# Patient Record
Sex: Female | Born: 1970 | Race: Black or African American | Hispanic: No | Smoking: Never smoker
Health system: Southern US, Community
[De-identification: ages and names within clinical notes are randomized; demographics above are authoritative.]

## PROBLEM LIST (undated history)

## (undated) DIAGNOSIS — E079 Disorder of thyroid, unspecified: Secondary | ICD-10-CM

## (undated) DIAGNOSIS — C73 Malignant neoplasm of thyroid gland: Secondary | ICD-10-CM

## (undated) DIAGNOSIS — R112 Nausea with vomiting, unspecified: Secondary | ICD-10-CM

## (undated) DIAGNOSIS — Z9889 Other specified postprocedural states: Secondary | ICD-10-CM

## (undated) DIAGNOSIS — D649 Anemia, unspecified: Secondary | ICD-10-CM

## (undated) DIAGNOSIS — F419 Anxiety disorder, unspecified: Secondary | ICD-10-CM

## (undated) HISTORY — PX: TUBAL LIGATION: SHX77

## (undated) HISTORY — PX: DIAGNOSTIC LAPAROSCOPY: SUR761

## (undated) HISTORY — PX: DILATION AND CURETTAGE OF UTERUS: SHX78

## (undated) HISTORY — PX: OTHER SURGICAL HISTORY: SHX169

---

## 1999-06-27 ENCOUNTER — Other Ambulatory Visit: Admission: RE | Admit: 1999-06-27 | Discharge: 1999-06-27 | Payer: Self-pay | Admitting: Obstetrics and Gynecology

## 2000-10-29 ENCOUNTER — Other Ambulatory Visit: Admission: RE | Admit: 2000-10-29 | Discharge: 2000-10-29 | Payer: Self-pay | Admitting: Obstetrics and Gynecology

## 2003-09-07 ENCOUNTER — Encounter: Admission: RE | Admit: 2003-09-07 | Discharge: 2003-09-07 | Payer: Self-pay | Admitting: Obstetrics and Gynecology

## 2003-09-07 ENCOUNTER — Encounter: Payer: Self-pay | Admitting: Obstetrics and Gynecology

## 2008-09-05 ENCOUNTER — Ambulatory Visit (HOSPITAL_COMMUNITY): Admission: AD | Admit: 2008-09-05 | Discharge: 2008-09-05 | Payer: Self-pay | Admitting: Obstetrics and Gynecology

## 2009-03-28 ENCOUNTER — Encounter: Admission: RE | Admit: 2009-03-28 | Discharge: 2009-03-28 | Payer: Self-pay | Admitting: Obstetrics and Gynecology

## 2010-03-30 ENCOUNTER — Ambulatory Visit (HOSPITAL_COMMUNITY): Admission: RE | Admit: 2010-03-30 | Discharge: 2010-03-30 | Payer: Self-pay | Admitting: Obstetrics and Gynecology

## 2010-12-24 ENCOUNTER — Encounter: Payer: Self-pay | Admitting: Obstetrics and Gynecology

## 2011-04-17 NOTE — Op Note (Signed)
NAMEBIRTHA, HATLER                 ACCOUNT NO.:  [id number]   MEDICAL RECORD NO.:  [id number]          PATIENT TYPE:  AMB   LOCATION:  [facility]                          FACILITY:  [facility]   PHYSICIAN:  Kendra H. Tenny Craw, MD     DATE OF BIRTH:  [birth year]   DATE OF PROCEDURE:  09/05/2008  DATE OF DISCHARGE:                               OPERATIVE REPORT   PREOPERATIVE DIAGNOSIS:  Left ectopic pregnancy.   POSTOPERATIVE DIAGNOSIS:  Left ectopic pregnancy.   PROCEDURE:  Diagnostic laparoscopy with left salpingectomy and a  dilation and curettage.   INDICATIONS FOR PROCEDURE:  Ms. Koble is a 40 year old G3, P0-0-2-0, who  is status post in vitro fertilization with embryo transfer on August 02, 2008.  She should have been 7 weeks and 4 days by embryo transfer.  She  was seen in the office and had a quantitative hCG drawn on August 23, 2008, which showed a quantitative hCG of 2000.  She returned to the  office on September 03, 2008, and a vaginal ultrasound was performed, which  demonstrated no evidence of intrauterine pregnancy with an endometrial  thickness of 7 mm and normal-appearing adnexa bilaterally.  Quantitative  hCG was drawn at that time, which demonstrated a quantitative hCG of  almost 17,000.  At this point, there was concern for an ectopic  pregnancy, and the patient was instructed to follow up in 48 hours for  repeat quantitative hCG and ultrasound.  On September 05, 2008, an  ultrasound demonstrated a left ectopic pregnancy, measuring 2.6 x 1.9 x  2.4 cm.  The right side was within normal limits.  No free fluid was  noted.  Given that the pregnancy resulted to embryo transfer, the  patient was advised that there was a possibility of a second ectopic  pregnancy versus a failed intrauterine pregnancy of the first embryo,  and the decision was made to also perform a dilation and curettage.   DESCRIPTION OF PROCEDURE:  Following the appropriate informed consent,  the patient was  brought to the operating room where she was placed in  the lithotomy position in [place] stirrups.  She is prepped and draped in  a normal sterile fashion.  A speculum was placed in the vagina and a  Hulka tenaculum was placed on the anterior lip of the cervix.  The  speculum was removed and attention was then turned to the abdominal  cavity.  A scalpel was then used to make a semilunar infraumbilical  incision.  S retractors were used to dissect down bluntly to the level  of the fascia.  The fascia was grasped with Kocher clamps x2, tented up,  and the fascia was incised with Mayo scissors.  Intraabdominal access  was confirmed with direct visualization.  Vicryl sutures #1 were used to  tag the fascia on either side and the Hasson port was introduced.  The  camera was then used to confirm intraabdominal placement, and the  abdominal cavity was insufflated.  At this time, a right lower quadrant  5-mm port was placed with direct visualization after  the skin was  incised with a scalpel.  A 10/12 mm port was then placed in the left  lower quadrant with direct visualization after a 10-mm incision was made  with the scalpel.  The ectopic was identified in the left tube.  The  ovaries were noted to be normal bilaterally.  The posterior cul-de-sac  was normal.  The uterus was normal.  The appendix was normal.  The liver  was normal, and the bowels were normal.  The tube was grasped with a  blunt grasper and the gyrus was used to excise the fallopian tube along  the mesosalpinx.  Hemostasis was noted.  Dr. Duane Lope of the teaching  service stepped in to the operating room for assistance with the removal  of the left tube.  The right and left lower quadrant ports were then  removed under direct visualization.  The abdomen was desufflated.  The  infraumbilical fascia was repaired with #1 Vicryl.  The skin incisions  were repaired with 3-0 subcuticular stitches and dermabond.  Attention  was then  turned top the vaginal portion of the case.  A speculum was  placed in the vagina and the hulka tenaculum removed and replaced with a  single toothed tenaculum.  The cervix was serially dilated and a sharp  curretage was performed with removal of endometrial curettings.  This  completed the case, the single tooth tenaculum was removed from the  cervix, and the speculum was removed.  The patient was taken out of  lithotomy position, anesthesia was reversed, and she was extubated and  transferred to the recovery room in stable condition.  All sponge, lap,  and needle counts were correct x 2.      Freddrick March. Tenny Craw, MD  Electronically Signed     KHR/MEDQ  D:  09/05/2008  T:  09/06/2008  Job:  [id number]

## 2011-06-21 ENCOUNTER — Other Ambulatory Visit: Payer: Self-pay | Admitting: Obstetrics and Gynecology

## 2011-09-04 LAB — CBC
HCT: 41.6
MCV: 88.5
RBC: 4.7
WBC: 8.3

## 2011-09-04 LAB — COMPREHENSIVE METABOLIC PANEL
Albumin: 3.4 — ABNORMAL LOW
BUN: 3 — ABNORMAL LOW
Creatinine, Ser: 0.79
Glucose, Bld: 94
Total Protein: 7.2

## 2011-09-04 LAB — TYPE AND SCREEN
ABO/RH(D): O POS
Antibody Screen: NEGATIVE

## 2011-09-04 LAB — PROTIME-INR: Prothrombin Time: 13.2

## 2011-09-04 LAB — APTT: aPTT: 29

## 2011-09-04 LAB — HCG, QUANTITATIVE, PREGNANCY: hCG, Beta Chain, Quant, S: 19795 — ABNORMAL HIGH

## 2011-09-25 ENCOUNTER — Other Ambulatory Visit: Payer: Self-pay | Admitting: Obstetrics and Gynecology

## 2011-10-24 ENCOUNTER — Encounter (HOSPITAL_COMMUNITY): Payer: Self-pay | Admitting: Pharmacist

## 2011-10-24 NOTE — H&P (Signed)
  HPI: 40 yo G3P0020 presents for a suction D&C for retained products after taking misoprostal for a 6 week missed abortion.  Pt was initially diagnosed with a missed abortion on 10/02/2011.  At that time she elected medical management with misoprostal.  She did have evacuation of some POCs but F/U ultrasound demonstrated retained POCs.  She attempted misoprostal on 2 subsequent occasions but this was unsuccessful at uterine evacuation.  She presents on 10/31/11 for a D&C for retained POCs.   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 POBGYN: [id number]  2008: Missed abortion s/p d&c  2009: Left ectopic, s/p L/S left salpingectomy  No abnormal paps, no STDs Meds: PNV ALL: NKDA PE: AFVSS AOX3 Soft NT/ND TVUS retained POCs A/P: 39 yo G3P0020 with retained POCs 1) Consent' 2) To OR for D&C

## 2011-10-31 ENCOUNTER — Ambulatory Visit (HOSPITAL_COMMUNITY)
Admission: RE | Admit: 2011-10-31 | Discharge: 2011-10-31 | Disposition: A | Discharge status: Home / Self Care | Payer: 59 | Source: Ambulatory Visit | Attending: Obstetrics and Gynecology | Admitting: Obstetrics and Gynecology

## 2011-10-31 ENCOUNTER — Encounter (HOSPITAL_COMMUNITY)
Admission: RE | Disposition: A | Discharge status: Home / Self Care | Payer: Self-pay | Source: Ambulatory Visit | Attending: Obstetrics and Gynecology

## 2011-10-31 ENCOUNTER — Other Ambulatory Visit: Payer: Self-pay | Admitting: Obstetrics and Gynecology

## 2011-10-31 ENCOUNTER — Encounter (HOSPITAL_COMMUNITY): Payer: Self-pay | Admitting: Certified Registered Nurse Anesthetist

## 2011-10-31 ENCOUNTER — Ambulatory Visit (HOSPITAL_COMMUNITY): Payer: 59 | Admitting: Certified Registered Nurse Anesthetist

## 2011-10-31 ENCOUNTER — Encounter (HOSPITAL_COMMUNITY): Payer: Self-pay | Admitting: *Deleted

## 2011-10-31 DIAGNOSIS — O021 Missed abortion: Secondary | ICD-10-CM | POA: Insufficient documentation

## 2011-10-31 HISTORY — PX: DILATION AND EVACUATION: SHX1459

## 2011-10-31 LAB — CBC
HCT: 35.4 % — ABNORMAL LOW (ref 36.0–46.0)
MCHC: 33.6 g/dL (ref 30.0–36.0)
MCV: 84.7 fL (ref 78.0–100.0)
Platelets: 360 10*3/uL (ref 150–400)
RDW: 13.4 % (ref 11.5–15.5)
WBC: 5.4 10*3/uL (ref 4.0–10.5)

## 2011-10-31 SURGERY — DILATION AND EVACUATION, UTERUS
Site: Uterus | Laterality: Bilateral | Wound class: Contaminated

## 2011-10-31 MED ORDER — MIDAZOLAM HCL 5 MG/5ML IJ SOLN
INTRAMUSCULAR | Status: DC | PRN
  Administered 2011-10-31: 2 mg via INTRAVENOUS

## 2011-10-31 MED ORDER — PROPOFOL 10 MG/ML IV EMUL
INTRAVENOUS | Status: AC
  Filled 2011-10-31: qty 20

## 2011-10-31 MED ORDER — MIDAZOLAM HCL 2 MG/2ML IJ SOLN
INTRAMUSCULAR | Status: AC
  Filled 2011-10-31: qty 2

## 2011-10-31 MED ORDER — ACETAMINOPHEN 325 MG PO TABS: 325.0000 mg | ORAL_TABLET | ORAL | Status: DC | PRN

## 2011-10-31 MED ORDER — SODIUM CHLORIDE 0.9 % IV SOLN: INTRAVENOUS | Status: DC

## 2011-10-31 MED ORDER — PROMETHAZINE HCL 25 MG/ML IJ SOLN: 6.2500 mg | INTRAMUSCULAR | Status: DC | PRN

## 2011-10-31 MED ORDER — KETOROLAC TROMETHAMINE 30 MG/ML IJ SOLN
INTRAMUSCULAR | Status: DC | PRN
  Administered 2011-10-31: 30 mg via INTRAVENOUS

## 2011-10-31 MED ORDER — ONDANSETRON HCL 4 MG/2ML IJ SOLN
INTRAMUSCULAR | Status: AC
  Filled 2011-10-31: qty 2

## 2011-10-31 MED ORDER — IBUPROFEN 600 MG PO TABS: 600.0000 mg | ORAL_TABLET | Freq: Four times a day (QID) | ORAL | Status: AC | PRN

## 2011-10-31 MED ORDER — KETOROLAC TROMETHAMINE 30 MG/ML IJ SOLN: 15.0000 mg | Freq: Once | INTRAMUSCULAR | Status: DC | PRN

## 2011-10-31 MED ORDER — ONDANSETRON HCL 4 MG/2ML IJ SOLN
INTRAMUSCULAR | Status: DC | PRN
  Administered 2011-10-31: 4 mg via INTRAVENOUS

## 2011-10-31 MED ORDER — GLYCOPYRROLATE 0.2 MG/ML IJ SOLN
INTRAMUSCULAR | Status: DC | PRN
  Administered 2011-10-31: 0.2 mg via INTRAVENOUS

## 2011-10-31 MED ORDER — FENTANYL CITRATE 0.05 MG/ML IJ SOLN
INTRAMUSCULAR | Status: DC | PRN
  Administered 2011-10-31 (×2): 50 ug via INTRAVENOUS

## 2011-10-31 MED ORDER — FENTANYL CITRATE 0.05 MG/ML IJ SOLN: 25.0000 ug | INTRAMUSCULAR | Status: DC | PRN

## 2011-10-31 MED ORDER — HYDROCODONE-ACETAMINOPHEN 5-500 MG PO TABS: 1.0000 | ORAL_TABLET | ORAL | Status: AC | PRN

## 2011-10-31 MED ORDER — LACTATED RINGERS IV SOLN
INTRAVENOUS | Status: DC
  Administered 2011-10-31 (×2): via INTRAVENOUS

## 2011-10-31 MED ORDER — LIDOCAINE HCL 1 % IJ SOLN
INTRAMUSCULAR | Status: DC | PRN
  Administered 2011-10-31: 10 mL

## 2011-10-31 MED ORDER — FENTANYL CITRATE 0.05 MG/ML IJ SOLN
INTRAMUSCULAR | Status: AC
  Filled 2011-10-31: qty 2

## 2011-10-31 MED ORDER — PROPOFOL 10 MG/ML IV EMUL
INTRAVENOUS | Status: DC | PRN
  Administered 2011-10-31 (×3): 20 mg via INTRAVENOUS
  Administered 2011-10-31: 50 mg via INTRAVENOUS
  Administered 2011-10-31 (×3): 20 mg via INTRAVENOUS

## 2011-10-31 MED ORDER — LIDOCAINE HCL (CARDIAC) 20 MG/ML IV SOLN
INTRAVENOUS | Status: DC | PRN
  Administered 2011-10-31: 50 mg via INTRAVENOUS

## 2011-10-31 MED ORDER — LIDOCAINE HCL (CARDIAC) 20 MG/ML IV SOLN
INTRAVENOUS | Status: AC
  Filled 2011-10-31: qty 5

## 2011-10-31 MED ORDER — GLYCOPYRROLATE 0.2 MG/ML IJ SOLN
INTRAMUSCULAR | Status: AC
  Filled 2011-10-31: qty 1

## 2011-10-31 SURGICAL SUPPLY — 12 items
CATH ROBINSON RED A/P 16FR (CATHETERS) ×3 IMPLANT
CLOTH BEACON ORANGE TIMEOUT ST (SAFETY) ×3 IMPLANT
DILATOR CANAL MILEX (MISCELLANEOUS) IMPLANT
DRAPE UTILITY XL STRL (DRAPES) ×3 IMPLANT
GLOVE BIO SURGEON STRL SZ7 (GLOVE) ×12 IMPLANT
GOWN PREVENTION PLUS LG XLONG (DISPOSABLE) ×3 IMPLANT
NEEDLE SPNL 22GX3.5 QUINCKE BK (NEEDLE) ×3 IMPLANT
PACK VAGINAL MINOR WOMEN LF (CUSTOM PROCEDURE TRAY) ×3 IMPLANT
PAD PREP 24X48 CUFFED NSTRL (MISCELLANEOUS) ×3 IMPLANT
SYR CONTROL 10ML LL (SYRINGE) ×3 IMPLANT
TOWEL OR 17X24 6PK STRL BLUE (TOWEL DISPOSABLE) ×6 IMPLANT
VACURETTE 6 ASPIR F TIP BERK (CANNULA) ×3 IMPLANT

## 2011-10-31 NOTE — Discharge Summary (Signed)
  D/C Summary  40 yo G3P0020 with retained products of conception after medical management of a 6 week missed abortion. Pt presented for a suction dilation and evacuation and underwent the procedure without incident.  She is discharged home with prescriptions for Vicodin and Ibuprofen.  She will follow up in the office in 2-4 weeks for a postoperative evaluation.

## 2011-10-31 NOTE — Transfer of Care (Signed)
Immediate Anesthesia Transfer of Care Note  Patient: Connie Randall  Procedure(s) Performed:  DILATATION AND EVACUATION (D&E)  Patient Location: PACU  Anesthesia Type: MAC  Level of Consciousness: awake, alert  and sedated  Airway & Oxygen Therapy: Patient Spontanous Breathing  Post-op Assessment: Report given to PACU RN and Post -op Vital signs reviewed and stable  Post vital signs: Reviewed and stable  Complications: No apparent anesthesia complications

## 2011-10-31 NOTE — Anesthesia Preprocedure Evaluation (Addendum)
Anesthesia Evaluation  Patient identified by MRN, date of birth, ID band Patient awake    Reviewed: Allergy & Precautions, H&P , Patient's Chart, lab work & pertinent test results, reviewed documented beta blocker date and time   History of Anesthesia Complications Negative for: history of anesthetic complications  Airway Mallampati: II TM Distance: >3 FB Neck ROM: full    Dental No notable dental hx.    Pulmonary neg pulmonary ROS,  clear to auscultation  Pulmonary exam normal       Cardiovascular Exercise Tolerance: Good neg cardio ROS regular Normal    Neuro/Psych Negative Neurological ROS  Negative Psych ROS   GI/Hepatic negative GI ROS, Neg liver ROS,   Endo/Other  Negative Endocrine ROS  Renal/GU negative Renal ROS     Musculoskeletal   Abdominal   Peds  Hematology negative hematology ROS (+)   Anesthesia Other Findings   Reproductive/Obstetrics negative OB ROS                           Anesthesia Physical Anesthesia Plan  ASA: II  Anesthesia Plan: MAC   Post-op Pain Management:    Induction:   Airway Management Planned:   Additional Equipment:   Intra-op Plan:   Post-operative Plan:   Informed Consent: I have reviewed the patients History and Physical, chart, labs and discussed the procedure including the risks, benefits and alternatives for the proposed anesthesia with the patient or authorized representative who has indicated his/her understanding and acceptance.   Dental Advisory Given  Plan Discussed with: CRNA and Surgeon  Anesthesia Plan Comments:         Anesthesia Quick Evaluation  

## 2011-10-31 NOTE — Interval H&P Note (Signed)
History and Physical Interval Note:  10/31/2011 11:10 AM  Connie Randall  has presented today for surgery, with the diagnosis of RETAINED PRODUCTS OF CONCEPTION  The various methods of treatment have been discussed with the patient and family. After consideration of risks, benefits and other options for treatment, the patient has consented to  Procedure(s): DILATATION AND CURETTAGE (D&C) as a surgical intervention .  The patients' history has been reviewed, patient examined, no change in status, stable for surgery.  I have reviewed the patients' chart and labs.  Questions were answered to the patient's satisfaction.     Dacey Milberger H.

## 2011-10-31 NOTE — Brief Op Note (Signed)
10/31/2011  12:46 PM  PATIENT:  Vernice Jefferson  40 y.o. female  PRE-OPERATIVE DIAGNOSIS:  RETAINED PRODUCTS OF CONCEPTION  POST-OPERATIVE DIAGNOSIS:  retained products of conception  PROCEDURE:  Procedure(s): DILATATION AND EVACUATION (D&E)  SURGEON:  Surgeon(s): Jasaun Carn H. Cinzia Devos  PHYSICIAN ASSISTANT:   ASSISTANTS: none   ANESTHESIA:   local and IV sedation  EBL:  Total I/O In: 1000 [I.V.:1000] Out: 100 [Urine:50; Blood:50]  BLOOD ADMINISTERED:none  DRAINS: Urinary Catheter (Foley)   LOCAL MEDICATIONS USED:  LIDOCAINE 10CC  SPECIMEN:  Source of Specimen:  products of conception  DISPOSITION OF SPECIMEN:  PATHOLOGY  COUNTS:  YES  TOURNIQUET:  * No tourniquets in log *  DICTATION: .Dragon Dictation  PLAN OF CARE: Admit to inpatient   PATIENT DISPOSITION:  Short Stay   Delay start of Pharmacological VTE agent (>24hrs) due to surgical blood loss or risk of bleeding:  {YES/NO/NOT APPLICABLE:20182

## 2011-10-31 NOTE — Anesthesia Postprocedure Evaluation (Signed)
Anesthesia Post Note  Patient: Connie Randall  Procedure(s) Performed:  DILATATION AND EVACUATION (D&E)  Anesthesia type: MAC  Patient location: PACU  Post pain: Pain level controlled  Post assessment: Post-op Vital signs reviewed  Last Vitals:  Filed Vitals:   10/31/11 1047  BP: 122/93  Pulse: 67  Temp: 36.8 C  Resp: 18    Post vital signs: Reviewed  Level of consciousness: sedated  Complications: No apparent anesthesia complications

## 2011-10-31 NOTE — Op Note (Addendum)
Pre-Operative Diagnosis: 1) Retained products of conception after failed medical evaluation Postoperative Diagnosis: same Procedure: Suction D&C Surgeon: Dr. Waynard Reeds Assistant: None Anesthesia: IV sedation and 10cc 1% lidocaine paracervical block Operative Findings: POCs Specimen: POCs [id number] I/O In: 1000 [I.V.:1000] Out: 100 [Urine:50; Blood:50] Procedure: Connie Randall is a 40 yo G3P0020 who was diagnosed with a 6 week missed abortion.  She attempted medical management with misoprostal evacuation but had retained products of conception.  All management options were reviewed and the patient requested to proceed with surgery.  R/B/A were reviewed.  After the appropriate informed consent the patient was taken to the OR and placed in lithotomy position in [place] stirrups.  IV sedation was administered and the patient was prepped and draped in the normal sterile fashion.  A speculum was placed in the vagina, a single-toothed tenaculum was placed on the anterior lip of the cervix. One % lidocaine, 10 cc were injected in a paracervical fashion. The cervix was serially dilated with Shawnie Pons dilators.  A #6 suction curette was placed transcervically to the level of the fundus.  The vacuum was engaged and multiple suction passes were undertaken with removal of products of conception.  A sharp curettage was performed and a gritty texture was noted.  This completed the procedure and the patient was taken to the PACU in stable condition.

## 2011-11-01 ENCOUNTER — Encounter (HOSPITAL_COMMUNITY): Payer: Self-pay | Admitting: Obstetrics and Gynecology

## 2011-11-24 DIAGNOSIS — X58XXXA Exposure to other specified factors, initial encounter: Secondary | ICD-10-CM | POA: Insufficient documentation

## 2011-11-24 DIAGNOSIS — T2640XA Burn of unspecified eye and adnexa, part unspecified, initial encounter: Secondary | ICD-10-CM | POA: Insufficient documentation

## 2011-11-25 ENCOUNTER — Encounter (HOSPITAL_BASED_OUTPATIENT_CLINIC_OR_DEPARTMENT_OTHER): Payer: Self-pay | Admitting: *Deleted

## 2011-11-25 ENCOUNTER — Emergency Department (HOSPITAL_BASED_OUTPATIENT_CLINIC_OR_DEPARTMENT_OTHER)
Admission: EM | Admit: 2011-11-25 | Discharge: 2011-11-25 | Disposition: A | Discharge status: Home / Self Care | Payer: 59 | Attending: Emergency Medicine | Admitting: Emergency Medicine

## 2011-11-25 DIAGNOSIS — T2690XA Corrosion of unspecified eye and adnexa, part unspecified, initial encounter: Secondary | ICD-10-CM

## 2011-11-25 MED ORDER — PREDNISOLONE ACETATE 1 % OP SUSP
1.0000 [drp] | OPHTHALMIC | Status: DC
  Administered 2011-11-25: 1 [drp] via OPHTHALMIC
  Filled 2011-11-25: qty 5

## 2011-11-25 MED ORDER — TETRACAINE HCL 0.5 % OP SOLN
OPHTHALMIC | Status: AC
  Filled 2011-11-25: qty 2

## 2011-11-25 MED ORDER — FLUORESCEIN SODIUM 1 MG OP STRP
ORAL_STRIP | OPHTHALMIC | Status: AC
  Filled 2011-11-25: qty 1

## 2011-11-25 MED ORDER — CIPROFLOXACIN HCL 0.3 % OP SOLN
2.0000 [drp] | OPHTHALMIC | Status: DC
  Administered 2011-11-25: 2 [drp] via OPHTHALMIC
  Filled 2011-11-25: qty 2.5

## 2011-11-25 NOTE — ED Notes (Signed)
Pt states she got glycolic acid in her left eye 1 week ago - was prescribed azelastine drops which are not working- has pain and light sensitivity- drainage also

## 2011-11-25 NOTE — ED Provider Notes (Signed)
History     CSN: [id number]  Arrival date & time 11/24/11  2338   First MD Initiated Contact with Patient 11/25/11 0534      Chief Complaint  Patient presents with  . Eye Injury    (Consider location/radiation/quality/duration/timing/severity/associated sxs/prior treatment) HPI  this is a 40 year old black female who got a chemical containing glycolic acid in her left eye about a week ago. She urinated at the time and was not significantly symptomatic. Subsequently the eye has become more and more irritated with moderate pain, exudate, erythema and severe photophobia. She saw her medical doctor 3 days ago and was prescribed Optivar 0.05% ophthalmic drops. She has not had any relief and has in fact worsened.  History reviewed. No pertinent past medical history.  Past Surgical History  Procedure Date  . Diagnostic laparoscopy   . Dilation and curettage of uterus   . Egg retrieval   . Dilation and evacuation 10/31/2011    Procedure: DILATATION AND EVACUATION (D&E);  Surgeon: Almon Hercules;  Location: [facility];  Service: Gynecology;;    No family history on file.  History  Substance Use Topics  . Smoking status: Never Smoker   . Smokeless tobacco: Never Used  . Alcohol Use: Yes     rare    OB History    Grav Para Term Preterm Abortions TAB SAB Ect Mult Living                  Review of Systems  All other systems reviewed and are negative.    Allergies  Review of patient's allergies indicates no known allergies.  Home Medications   Current Outpatient Rx  Name Route Sig Dispense Refill  . AZELASTINE HCL 0.05 % OP SOLN  1 drop 2 (two) times daily.      Marland Kitchen CLINDAMYCIN HCL PO Oral Take by mouth.      Marland Kitchen BEYAZ PO Oral Take by mouth.        BP 130/69  Pulse 71  Temp(Src) 98.3 F (36.8 C) (Oral)  Resp 19  SpO2 100%  LMP 11/22/2011  Physical Exam General: Well-developed, well-nourished female in no acute distress; appearance consistent with age of record HENT:  normocephalic, atraumatic Eyes: pupils equal round and reactive to light; extraocular muscles intact; left conjunctival injection and exudate; slight left corneal opacity without fluorescein uptake under Wood's lamp examination Neck: supple Heart: regular rate and rhythm Lungs: Normal respiratory effort and Abdomen: soft; nondistended Extremities: No deformity; full range of motion Neurologic: Awake, alert and oriented; motor function intact in all extremities and symmetric; no facial droop Skin: Warm and dry Psychiatric: Normal mood and affect    ED Course  Procedures (including critical care time)   MDM  Dr. Delaney Meigs will see the patient in his office today at 10:30 AM. He advised Pred forte and fluoroquinolone eyedrops hourly until he sees her.          Hanley Seamen, MD 11/25/11 [phone]

## 2014-09-28 ENCOUNTER — Other Ambulatory Visit: Payer: Self-pay | Admitting: Obstetrics and Gynecology

## 2014-09-29 LAB — CYTOLOGY - PAP

## 2014-12-29 ENCOUNTER — Encounter (HOSPITAL_BASED_OUTPATIENT_CLINIC_OR_DEPARTMENT_OTHER): Payer: Self-pay

## 2014-12-29 ENCOUNTER — Emergency Department (HOSPITAL_BASED_OUTPATIENT_CLINIC_OR_DEPARTMENT_OTHER)
Admission: EM | Admit: 2014-12-29 | Discharge: 2014-12-29 | Disposition: A | Discharge status: Home / Self Care | Payer: 59 | Attending: Emergency Medicine | Admitting: Emergency Medicine

## 2014-12-29 DIAGNOSIS — M549 Dorsalgia, unspecified: Secondary | ICD-10-CM | POA: Diagnosis present

## 2014-12-29 DIAGNOSIS — R0602 Shortness of breath: Secondary | ICD-10-CM | POA: Diagnosis not present

## 2014-12-29 DIAGNOSIS — Z3202 Encounter for pregnancy test, result negative: Secondary | ICD-10-CM | POA: Diagnosis not present

## 2014-12-29 DIAGNOSIS — R112 Nausea with vomiting, unspecified: Secondary | ICD-10-CM | POA: Diagnosis not present

## 2014-12-29 DIAGNOSIS — N390 Urinary tract infection, site not specified: Secondary | ICD-10-CM | POA: Diagnosis not present

## 2014-12-29 DIAGNOSIS — H539 Unspecified visual disturbance: Secondary | ICD-10-CM | POA: Insufficient documentation

## 2014-12-29 LAB — URINALYSIS, ROUTINE W REFLEX MICROSCOPIC
Bilirubin Urine: NEGATIVE
GLUCOSE, UA: NEGATIVE mg/dL
Ketones, ur: 15 mg/dL — AB
Nitrite: POSITIVE — AB
PH: 5.5 (ref 5.0–8.0)
Protein, ur: NEGATIVE mg/dL
Specific Gravity, Urine: 1.014 (ref 1.005–1.030)
UROBILINOGEN UA: 2 mg/dL — AB (ref 0.0–1.0)

## 2014-12-29 LAB — URINE MICROSCOPIC-ADD ON

## 2014-12-29 LAB — PREGNANCY, URINE: Preg Test, Ur: NEGATIVE

## 2014-12-29 MED ORDER — FLUCONAZOLE 50 MG PO TABS
150.0000 mg | ORAL_TABLET | Freq: Once | ORAL | Status: AC
  Administered 2014-12-29: 150 mg via ORAL
  Filled 2014-12-29 (×2): qty 1

## 2014-12-29 MED ORDER — HYDROCODONE-ACETAMINOPHEN 5-325 MG PO TABS: 1.0000 | ORAL_TABLET | Freq: Four times a day (QID) | ORAL | Status: DC | PRN

## 2014-12-29 MED ORDER — LIDOCAINE HCL (PF) 1 % IJ SOLN
INTRAMUSCULAR | Status: AC
  Administered 2014-12-29: 2.1 mL
  Filled 2014-12-29: qty 5

## 2014-12-29 MED ORDER — ZOFRAN ODT 4 MG PO TBDP: 4.0000 mg | ORAL_TABLET | Freq: Three times a day (TID) | ORAL | Status: DC | PRN

## 2014-12-29 MED ORDER — CEFTRIAXONE SODIUM 1 G IJ SOLR
1.0000 g | Freq: Once | INTRAMUSCULAR | Status: AC
  Administered 2014-12-29: 1 g via INTRAMUSCULAR
  Filled 2014-12-29: qty 10

## 2014-12-29 NOTE — ED Notes (Signed)
Blood in urine approx 10 days ago-GYN called in macrobid 1/18-started abd last night-c/o lower back pain

## 2014-12-29 NOTE — ED Notes (Signed)
Pt notified that she has to be seen by md prior to food/drink, blanket provided to patient per request

## 2014-12-29 NOTE — ED Provider Notes (Signed)
CSN: [id number]     Arrival date & time 12/29/14  1805 History  This chart was scribed for Fredia Sorrow, MD by Tula Nakayama, ED Scribe. This patient was seen in room MH11/MH11 and the patient's care was started at 8:32 PM  Chief Complaint  Patient presents with  . Back Pain   Patient is a 44 y.o. female presenting with dysuria. The history is provided by the patient. No language interpreter was used.  Dysuria Pain quality:  Burning Pain severity:  Moderate Onset quality:  Gradual Duration:  10 days Timing:  Constant Progression:  Unchanged Chronicity:  New Recent urinary tract infections: no   Relieved by:  None tried Worsened by:  Nothing tried Ineffective treatments: Natural supplement. Urinary symptoms: hematuria   Urinary symptoms: no frequent urination   Associated symptoms: abdominal pain, nausea and vomiting   Associated symptoms: no fever     HPI Comments: Connie Randall is a 44 y.o. female who presents to the Emergency Department complaining of constant dysuria, hematuria, back pain and chills that started 10 days ago. She states frequency and abdominal pain that are currently resolved and difficulty sleeping due to pain as associated symptoms. She also notes 1 episode of vomiting that occurred 6 days ago. Pt tried natural supplement starting 10 days ago with some relief. She also started Macrobid prescribed by her GYN yesterday with no change in symptoms. Pt denies allergies to antibiotics.   History reviewed. No pertinent past medical history. Past Surgical History  Procedure Laterality Date  . Diagnostic laparoscopy    . Dilation and curettage of uterus    . Egg retrieval    . Dilation and evacuation  10/31/2011    Procedure: DILATATION AND EVACUATION (D&E);  Surgeon: Marcial Pacas;  Location: [facility];  Service: Gynecology;;   No family history on file. History  Substance Use Topics  . Smoking status: Never Smoker   . Smokeless tobacco: Never Used  . Alcohol  Use: Yes     Comment: rare   OB History    No data available     Review of Systems  Constitutional: Positive for chills. Negative for fever.  HENT: Negative for congestion, rhinorrhea and sore throat.   Eyes: Positive for visual disturbance.  Respiratory: Positive for shortness of breath. Negative for cough.   Cardiovascular: Negative for chest pain and leg swelling.  Gastrointestinal: Positive for nausea, vomiting and abdominal pain. Negative for diarrhea.  Genitourinary: Positive for dysuria, frequency and hematuria.  Musculoskeletal: Positive for back pain. Negative for neck pain.  Skin: Negative for rash.  Neurological: Negative for headaches.  Hematological: Does not bruise/bleed easily.  Psychiatric/Behavioral: Negative for confusion.   Allergies  Review of patient's allergies indicates no known allergies.  Home Medications   Prior to Admission medications   Medication Sig Start Date End Date Taking? Authorizing Provider  HYDROcodone-acetaminophen (NORCO/VICODIN) 5-325 MG per tablet Take 1-2 tablets by mouth every 6 (six) hours as needed for moderate pain. 12/29/14   Fredia Sorrow, MD  ZOFRAN ODT 4 MG disintegrating tablet Take 1 tablet (4 mg total) by mouth every 8 (eight) hours as needed for nausea or vomiting. 12/29/14   Fredia Sorrow, MD   BP 118/78 mmHg  Pulse 72  Temp(Src) 98.2 F (36.8 C) (Oral)  Resp 16  Ht 5\' 4"  (1.626 m)  Wt 178 lb (80.74 kg)  BMI 30.54 kg/m2  SpO2 99%  LMP 12/22/2014 Physical Exam  Constitutional: She is oriented to person, place, and  time. She appears well-developed and well-nourished. No distress.  HENT:  Head: Normocephalic and atraumatic.  Mouth/Throat: Oropharynx is clear and moist.  Eyes: Conjunctivae and EOM are normal. Pupils are equal, round, and reactive to light.  Sclera clear  Neck: Neck supple. No tracheal deviation present.  Cardiovascular: Normal rate, regular rhythm and normal heart sounds.   No murmur  heard. Pulmonary/Chest: Effort normal and breath sounds normal. No respiratory distress.  Lungs clear bilaterally  Abdominal: Bowel sounds are normal. There is no tenderness.  Musculoskeletal:  No ankle swelling  Neurological: She is alert and oriented to person, place, and time. No cranial nerve deficit. She exhibits normal muscle tone. Coordination normal.  Skin: Skin is warm and dry.  Psychiatric: She has a normal mood and affect. Her behavior is normal.  Nursing note and vitals reviewed.   ED Course  Procedures (including critical care time) DIAGNOSTIC STUDIES: Oxygen Saturation is 98% on RA, normal by my interpretation.    COORDINATION OF CARE: 8:48 PM Discussed treatment plan with pt which includes Rocephin injection and Diflucan. Pt agreed to plan.   Labs Review Labs Reviewed  URINALYSIS, ROUTINE W REFLEX MICROSCOPIC - Abnormal; Notable for the following:    Color, Urine ORANGE (*)    Hgb urine dipstick TRACE (*)    Ketones, ur 15 (*)    Urobilinogen, UA 2.0 (*)    Nitrite POSITIVE (*)    Leukocytes, UA SMALL (*)    All other components within normal limits  URINE MICROSCOPIC-ADD ON - Abnormal; Notable for the following:    Bacteria, UA FEW (*)    All other components within normal limits  URINE CULTURE  PREGNANCY, URINE   Results for orders placed or performed during the hospital encounter of 12/29/14  Urinalysis, Routine w reflex microscopic  Result Value Ref Range   Color, Urine ORANGE (A) YELLOW   APPearance CLEAR CLEAR   Specific Gravity, Urine 1.014 1.005 - 1.030   pH 5.5 5.0 - 8.0   Glucose, UA NEGATIVE NEGATIVE mg/dL   Hgb urine dipstick TRACE (A) NEGATIVE   Bilirubin Urine NEGATIVE NEGATIVE   Ketones, ur 15 (A) NEGATIVE mg/dL   Protein, ur NEGATIVE NEGATIVE mg/dL   Urobilinogen, UA 2.0 (H) 0.0 - 1.0 mg/dL   Nitrite POSITIVE (A) NEGATIVE   Leukocytes, UA SMALL (A) NEGATIVE  Pregnancy, urine  Result Value Ref Range   Preg Test, Ur NEGATIVE NEGATIVE   Urine microscopic-add on  Result Value Ref Range   Squamous Epithelial / LPF RARE RARE   WBC, UA 7-10 <3 WBC/hpf   RBC / HPF 0-2 <3 RBC/hpf   Bacteria, UA FEW (A) RARE     Imaging Review No results found.   EKG Interpretation None      MDM   Final diagnoses:  UTI (lower urinary tract infection)   Patient with urinary tract symptoms for a while was treated with the natural substances initially started the [place] provided by her GYN doctor without a urine or urine culture yesterday. His had 2 doses. Urinalysis with positive nitrite but not a significant amount of white blood cells. Symptoms could be early pie lobe urinalysis not consistent with that. Patient given Rocephin here. Patient will continue the Macrobid urine sent for culture. Patient will return if not better in 2 days. Patient currently without any vomiting. No fevers.  I personally performed the services described in this documentation, which was scribed in my presence. The recorded information has been reviewed and is accurate.  Fredia Sorrow, MD 12/29/14 2135

## 2014-12-29 NOTE — Discharge Instructions (Signed)
Continue the Macrobid. Should be better in 2 days if not return. Return earlier for any new or worse symptoms. Hydrocodone provided as needed for pain. Zofran as needed for nausea and vomiting.

## 2014-12-29 NOTE — ED Notes (Signed)
MD at bedside. 

## 2014-12-30 LAB — URINE CULTURE

## 2016-05-09 ENCOUNTER — Other Ambulatory Visit: Payer: Self-pay | Admitting: Otolaryngology

## 2016-05-09 DIAGNOSIS — E041 Nontoxic single thyroid nodule: Secondary | ICD-10-CM

## 2016-05-25 ENCOUNTER — Ambulatory Visit
Admission: RE | Admit: 2016-05-25 | Discharge: 2016-05-25 | Disposition: A | Discharge status: Home / Self Care | Payer: 59 | Source: Ambulatory Visit | Attending: Otolaryngology | Admitting: Otolaryngology

## 2016-05-25 DIAGNOSIS — E041 Nontoxic single thyroid nodule: Secondary | ICD-10-CM

## 2016-06-20 ENCOUNTER — Other Ambulatory Visit: Payer: Self-pay | Admitting: Obstetrics and Gynecology

## 2016-06-21 LAB — CYTOLOGY - PAP

## 2016-07-25 ENCOUNTER — Other Ambulatory Visit: Payer: Self-pay | Admitting: Otolaryngology

## 2016-07-26 ENCOUNTER — Ambulatory Visit: Payer: Self-pay | Admitting: Otolaryngology

## 2016-07-26 NOTE — H&P (Signed)
Otolaryngology Office Note  HPI:   Connie Randall is a 45 y.o. female who presents as a new Patient.   Referring Provider: self  Chief complaint: sinus.  HPI: Here for sinus problems. She was having some postnasal drip and some facial pain and pressure. She saw her dentist who recommended root canal. The endodontically said that her teeth were fine and suggested that she may have a sinus problem. A few weeks ago she developed acute onset of vertigo with nausea and vomiting that lasted a few days and has been gradually getting better. She never had any change in her hearing. She was diagnosed with sinusitis and fluid in her years. She doesn't have any nasal symptoms. She does drink about 32 ounces of iced tea daily and eats a fair amount of chocolate and peppermint. She had a small growth on her upper gingival surface removed twice in the past 6 years. He doesn't really cause her any symptoms except if she brushes her teeth sometimes it bleeds.  PMH/Meds/All/SocHx/FamHx/ROS:   History reviewed. No pertinent past medical history.  Past Surgical History:  Procedure Laterality Date  . etopic pregnancy  . ivf  . WISDOM TOOTH EXTRACTION   No family history of bleeding disorders, wound healing problems or difficulty with anesthesia.   Social History   Social History  . Marital status: Married  Spouse name: N/A  . Number of children: N/A  . Years of education: N/A   Occupational History  . Not on file.   Social History Main Topics  . Smoking status: Never Smoker  . Smokeless tobacco: Never Used  . Alcohol use Not on file  . Drug use: Not on file  . Sexual activity: Not on file   Other Topics Concern  . Not on file   Social History Narrative  . No narrative on file   No current outpatient prescriptions on file.  A complete ROS was performed with pertinent positives/negatives noted in the HPI. The remainder of the ROS are negative.   Physical Exam:   BP 120/80  Ht 1.626 m  (5\' 4" )  Wt 82.1 kg (181 lb)  BMI 31.07 kg/m2  General: Healthy and alert, in no distress, breathing easily. Normal affect. In a pleasant mood. Head: Normocephalic, atraumatic. No masses, or scars. Eyes: Pupils are equal, and reactive to light. Vision is grossly intact. No spontaneous or gaze nystagmus. Ears: Ear canals are clear. Tympanic membranes are intact, with normal landmarks and the middle ears are clear and healthy. Hearing: Grossly normal. Nose. Nasal cavities are clear with healthy mucosa, no polyps or exudate.Airways are patent. Face: No masses or scars, facial nerve function is symmetric. Oral Cavity: Small benign appearing papilloma left upper gingival mucosa.No other mucosal abnormalities are noted. Tongue with normal mobility. Dentition appears healthy. Oropharynx: Tonsils are symmetric. There are no mucosal masses identified. Tongue base appears normal and healthy. Larynx/Hypopharynx: deferred Neck: No palpable masses, no cervical adenopathy,  Small thyroid nodule palpable left side. Neuro: Cranial nerves II-XII will normal function. Balance: Normal gate. Other findings: none.  Independent Review of Additional Tests or Records:  none  Procedures:  none  Impression & Plans:  There is evidence of right maxillary sinus disease but it is unlikely this is related to most of her other symptoms. The vertigo is acute vestibular neuronitis and is unrelated to any of the other problems. This will continue to improve. The postnasal drainage and throat symptoms or reflux related. I'm not sure of this source of  her facial pain and pressure although I would recommend that we treat this sinusitis with 2 weeks of Augmentin and will have her followup in 3-4 weeks. Recommend thyroid ultrasound.

## 2016-07-30 ENCOUNTER — Encounter (HOSPITAL_COMMUNITY): Payer: Self-pay

## 2016-07-30 ENCOUNTER — Ambulatory Visit (HOSPITAL_COMMUNITY)
Admission: RE | Admit: 2016-07-30 | Discharge: 2016-07-30 | Disposition: A | Discharge status: Home / Self Care | Payer: 59 | Source: Ambulatory Visit | Attending: Otolaryngology | Admitting: Otolaryngology

## 2016-07-30 ENCOUNTER — Encounter (HOSPITAL_COMMUNITY)
Admission: RE | Admit: 2016-07-30 | Discharge: 2016-07-30 | Disposition: A | Discharge status: Home / Self Care | Payer: 59 | Source: Ambulatory Visit | Attending: Otolaryngology | Admitting: Otolaryngology

## 2016-07-30 DIAGNOSIS — E039 Hypothyroidism, unspecified: Secondary | ICD-10-CM | POA: Insufficient documentation

## 2016-07-30 DIAGNOSIS — J9811 Atelectasis: Secondary | ICD-10-CM | POA: Diagnosis not present

## 2016-07-30 DIAGNOSIS — R918 Other nonspecific abnormal finding of lung field: Secondary | ICD-10-CM | POA: Insufficient documentation

## 2016-07-30 HISTORY — DX: Other specified postprocedural states: Z98.890

## 2016-07-30 HISTORY — DX: Anxiety disorder, unspecified: F41.9

## 2016-07-30 HISTORY — DX: Other specified postprocedural states: R11.2

## 2016-07-30 LAB — CBC
HCT: 40.7 % (ref 36.0–46.0)
Hemoglobin: 13.6 g/dL (ref 12.0–15.0)
MCH: 28.9 pg (ref 26.0–34.0)
MCHC: 33.4 g/dL (ref 30.0–36.0)
MCV: 86.6 fL (ref 78.0–100.0)
PLATELETS: 325 10*3/uL (ref 150–400)
RBC: 4.7 MIL/uL (ref 3.87–5.11)
RDW: 13.6 % (ref 11.5–15.5)
WBC: 6.9 10*3/uL (ref 4.0–10.5)

## 2016-07-30 LAB — HCG, SERUM, QUALITATIVE: PREG SERUM: NEGATIVE

## 2016-07-30 NOTE — Pre-Procedure Instructions (Signed)
    Connie Randall  07/30/2016      [place] [address] Phone: [phone] Fax: [phone]  [facility] - [place] [address] AT [place] [address] [place] [address] Phone: [phone] Fax: [phone]    Your procedure is scheduled on 08/03/16.  Report to [facility] Admitting at 530 A.M.  Call this number if you have problems the morning of surgery:  [phone]   Remember:  Do not eat food or drink liquids after midnight.  Take these medicines the morning of surgery with A SIP OF WATER --none   Do not wear jewelry, make-up or nail polish.  Do not wear lotions, powders, or perfumes, or deoderant.  Do not shave 48 hours prior to surgery.  Men may shave face and neck.  Do not bring valuables to the hospital.  [facility] is not responsible for any belongings or valuables.  Contacts, dentures or bridgework may not be worn into surgery.  Leave your suitcase in the car.  After surgery it may be brought to your room.  For patients admitted to the hospital, discharge time will be determined by your treatment team.  Patients discharged the day of surgery will not be allowed to drive home Name and phone number of your driver:    Special instructions:  Do not take any aspirin,anti-inflammatories,vitamins,or herbal supplements 5-7 days prior to surgery.  Please read over the following fact sheets that you were given.

## 2016-08-01 NOTE — Progress Notes (Signed)
Anesthesia Chart Review: Patient is a 45 year old female scheduled for left thyroid lobectomy, frozen section, possible total thyroidectomy on 08/03/16 by Dr. Constance Holster. DX: thyroid mass.  History includes never smoker, post-operative N/V, anxiety. No PCP listed.   BP 126/84   Pulse 69   Temp 36.8 C   Resp 20   Ht 5\' 4"  (1.626 m)   Wt 201 lb 4.8 oz (91.3 kg)   LMP 07/30/2016   SpO2 96%   BMI 34.55 kg/m    07/30/16 CXR: IMPRESSION: Low lung volumes with mild bibasilar atelectasis.  Preoperative CBC and pregnancy test noted. No TSH noted. Would defer decision if this is needed to Dr. Constance Holster. Message left with MA Tracey at his office regarding this.   Myra Gianotti, PA-C [facility] Short Stay Center/Anesthesiology Phone [phone] 08/01/2016 9:00 AM

## 2016-08-03 ENCOUNTER — Encounter (HOSPITAL_COMMUNITY): Payer: Self-pay | Admitting: Urology

## 2016-08-03 ENCOUNTER — Observation Stay (HOSPITAL_COMMUNITY)
Admission: RE | Admit: 2016-08-03 | Discharge: 2016-08-04 | Disposition: A | Discharge status: Home / Self Care | Payer: 59 | Source: Ambulatory Visit | Attending: Otolaryngology | Admitting: Otolaryngology

## 2016-08-03 ENCOUNTER — Ambulatory Visit (HOSPITAL_COMMUNITY): Payer: 59 | Admitting: Anesthesiology

## 2016-08-03 ENCOUNTER — Encounter (HOSPITAL_COMMUNITY)
Admission: RE | Disposition: A | Discharge status: Home / Self Care | Payer: Self-pay | Source: Ambulatory Visit | Attending: Otolaryngology

## 2016-08-03 ENCOUNTER — Ambulatory Visit (HOSPITAL_COMMUNITY): Payer: 59 | Admitting: Vascular Surgery

## 2016-08-03 DIAGNOSIS — C73 Malignant neoplasm of thyroid gland: Principal | ICD-10-CM | POA: Insufficient documentation

## 2016-08-03 DIAGNOSIS — Z9889 Other specified postprocedural states: Secondary | ICD-10-CM

## 2016-08-03 DIAGNOSIS — E079 Disorder of thyroid, unspecified: Secondary | ICD-10-CM | POA: Diagnosis present

## 2016-08-03 DIAGNOSIS — E890 Postprocedural hypothyroidism: Secondary | ICD-10-CM

## 2016-08-03 HISTORY — PX: THYROIDECTOMY: SHX17

## 2016-08-03 HISTORY — DX: Disorder of thyroid, unspecified: E07.9

## 2016-08-03 SURGERY — THYROIDECTOMY
Anesthesia: General | Laterality: Left

## 2016-08-03 MED ORDER — LIDOCAINE 2% (20 MG/ML) 5 ML SYRINGE
INTRAMUSCULAR | Status: AC
  Filled 2016-08-03: qty 5

## 2016-08-03 MED ORDER — ONDANSETRON HCL 4 MG/2ML IJ SOLN
INTRAMUSCULAR | Status: AC
  Filled 2016-08-03: qty 2

## 2016-08-03 MED ORDER — FENTANYL CITRATE (PF) 100 MCG/2ML IJ SOLN
INTRAMUSCULAR | Status: AC
Start: 2016-08-03 — End: 2016-08-03
  Filled 2016-08-03: qty 2

## 2016-08-03 MED ORDER — 0.9 % SODIUM CHLORIDE (POUR BTL) OPTIME
TOPICAL | Status: DC | PRN
  Administered 2016-08-03: 1000 mL

## 2016-08-03 MED ORDER — MIDAZOLAM HCL 5 MG/5ML IJ SOLN
INTRAMUSCULAR | Status: DC | PRN
  Administered 2016-08-03 (×2): 1 mg via INTRAVENOUS

## 2016-08-03 MED ORDER — SCOPOLAMINE 1 MG/3DAYS TD PT72
MEDICATED_PATCH | TRANSDERMAL | Status: DC | PRN
  Administered 2016-08-03: 1 via TRANSDERMAL

## 2016-08-03 MED ORDER — PROMETHAZINE HCL 25 MG RE SUPP: 25.0000 mg | Freq: Four times a day (QID) | RECTAL | 1 refills | Status: DC | PRN

## 2016-08-03 MED ORDER — LACTATED RINGERS IV SOLN
INTRAVENOUS | Status: DC | PRN
  Administered 2016-08-03 (×2): via INTRAVENOUS

## 2016-08-03 MED ORDER — PROPOFOL 10 MG/ML IV BOLUS
INTRAVENOUS | Status: AC
  Filled 2016-08-03: qty 20

## 2016-08-03 MED ORDER — MIDAZOLAM HCL 2 MG/2ML IJ SOLN
INTRAMUSCULAR | Status: AC
  Filled 2016-08-03: qty 2

## 2016-08-03 MED ORDER — PROMETHAZINE HCL 25 MG PO TABS: 25.0000 mg | ORAL_TABLET | Freq: Four times a day (QID) | ORAL | Status: DC | PRN

## 2016-08-03 MED ORDER — FENTANYL CITRATE (PF) 100 MCG/2ML IJ SOLN
INTRAMUSCULAR | Status: DC | PRN
  Administered 2016-08-03 (×4): 50 ug via INTRAVENOUS

## 2016-08-03 MED ORDER — PROMETHAZINE HCL 25 MG RE SUPP: 25.0000 mg | Freq: Four times a day (QID) | RECTAL | Status: DC | PRN

## 2016-08-03 MED ORDER — ROCURONIUM BROMIDE 10 MG/ML (PF) SYRINGE
PREFILLED_SYRINGE | INTRAVENOUS | Status: AC
  Filled 2016-08-03: qty 10

## 2016-08-03 MED ORDER — LIDOCAINE HCL (CARDIAC) 20 MG/ML IV SOLN
INTRAVENOUS | Status: DC | PRN
  Administered 2016-08-03: 100 mg via INTRAVENOUS

## 2016-08-03 MED ORDER — LABETALOL HCL 5 MG/ML IV SOLN
INTRAVENOUS | Status: DC | PRN
  Administered 2016-08-03: 5 mg via INTRAVENOUS
  Administered 2016-08-03 (×2): 2.5 mg via INTRAVENOUS
  Administered 2016-08-03: 5 mg via INTRAVENOUS

## 2016-08-03 MED ORDER — SCOPOLAMINE 1 MG/3DAYS TD PT72
MEDICATED_PATCH | TRANSDERMAL | Status: AC
Start: 2016-08-03 — End: 2016-08-03
  Filled 2016-08-03: qty 1

## 2016-08-03 MED ORDER — ONDANSETRON HCL 4 MG/2ML IJ SOLN
INTRAMUSCULAR | Status: DC | PRN
  Administered 2016-08-03: 4 mg via INTRAVENOUS

## 2016-08-03 MED ORDER — SUGAMMADEX SODIUM 200 MG/2ML IV SOLN
INTRAVENOUS | Status: AC
  Filled 2016-08-03: qty 2

## 2016-08-03 MED ORDER — LABETALOL HCL 5 MG/ML IV SOLN
INTRAVENOUS | Status: AC
  Filled 2016-08-03: qty 4

## 2016-08-03 MED ORDER — HYDROCODONE-ACETAMINOPHEN 7.5-325 MG PO TABS: 1.0000 | ORAL_TABLET | Freq: Four times a day (QID) | ORAL | 0 refills | Status: DC | PRN

## 2016-08-03 MED ORDER — DEXTROSE-NACL 5-0.9 % IV SOLN
INTRAVENOUS | Status: DC
  Administered 2016-08-03: 15:00:00 via INTRAVENOUS

## 2016-08-03 MED ORDER — PROPOFOL 10 MG/ML IV BOLUS
INTRAVENOUS | Status: DC | PRN
  Administered 2016-08-03 (×2): 20 mg via INTRAVENOUS
  Administered 2016-08-03: 130 mg via INTRAVENOUS

## 2016-08-03 MED ORDER — HYDROCODONE-ACETAMINOPHEN 5-325 MG PO TABS: 1.0000 | ORAL_TABLET | ORAL | Status: DC | PRN

## 2016-08-03 MED ORDER — ROCURONIUM BROMIDE 100 MG/10ML IV SOLN
INTRAVENOUS | Status: DC | PRN
  Administered 2016-08-03: 50 mg via INTRAVENOUS

## 2016-08-03 MED ORDER — SUGAMMADEX SODIUM 200 MG/2ML IV SOLN
INTRAVENOUS | Status: DC | PRN
  Administered 2016-08-03: 180 mg via INTRAVENOUS

## 2016-08-03 MED ORDER — IBUPROFEN 100 MG/5ML PO SUSP
400.0000 mg | Freq: Four times a day (QID) | ORAL | Status: DC | PRN
  Filled 2016-08-03: qty 20

## 2016-08-03 SURGICAL SUPPLY — 37 items
BLADE SURG 15 STRL LF DISP TIS (BLADE) IMPLANT
BLADE SURG 15 STRL SS (BLADE)
CANISTER SUCTION 2500CC (MISCELLANEOUS) ×3 IMPLANT
CLEANER TIP ELECTROSURG 2X2 (MISCELLANEOUS) ×3 IMPLANT
CONT SPEC 4OZ CLIKSEAL STRL BL (MISCELLANEOUS) ×3 IMPLANT
CORDS BIPOLAR (ELECTRODE) ×3 IMPLANT
COVER SURGICAL LIGHT HANDLE (MISCELLANEOUS) ×3 IMPLANT
DERMABOND ADVANCED (GAUZE/BANDAGES/DRESSINGS) ×2
DERMABOND ADVANCED .7 DNX12 (GAUZE/BANDAGES/DRESSINGS) ×1 IMPLANT
DRAIN HEMOVAC 7FR (DRAIN) IMPLANT
DRAIN SNY 10 ROU (WOUND CARE) IMPLANT
ELECT COATED BLADE 2.86 ST (ELECTRODE) ×3 IMPLANT
ELECT REM PT RETURN 9FT ADLT (ELECTROSURGICAL) ×3
ELECTRODE REM PT RTRN 9FT ADLT (ELECTROSURGICAL) ×1 IMPLANT
EVACUATOR SILICONE 100CC (DRAIN) ×3 IMPLANT
FORCEPS BIPOLAR SPETZLER 8 1.0 (NEUROSURGERY SUPPLIES) ×3 IMPLANT
GAUZE SPONGE 4X4 16PLY XRAY LF (GAUZE/BANDAGES/DRESSINGS) ×3 IMPLANT
GLOVE BIO SURGEON STRL SZ 6.5 (GLOVE) ×2 IMPLANT
GLOVE BIO SURGEONS STRL SZ 6.5 (GLOVE) ×1
GLOVE ECLIPSE 7.5 STRL STRAW (GLOVE) ×3 IMPLANT
GOWN STRL REUS W/ TWL LRG LVL3 (GOWN DISPOSABLE) ×3 IMPLANT
GOWN STRL REUS W/TWL LRG LVL3 (GOWN DISPOSABLE) ×6
KIT BASIN OR (CUSTOM PROCEDURE TRAY) ×3 IMPLANT
KIT ROOM TURNOVER OR (KITS) ×3 IMPLANT
NEEDLE 27GAX1/2IN MONOJET (NEEDLE) ×3 IMPLANT
NEEDLE 27GX1/2 REG BEVEL ECLIP (NEEDLE) IMPLANT
NS IRRIG 1000ML POUR BTL (IV SOLUTION) ×3 IMPLANT
PAD ARMBOARD 7.5X6 YLW CONV (MISCELLANEOUS) ×6 IMPLANT
PENCIL FOOT CONTROL (ELECTRODE) ×3 IMPLANT
SHEARS HARMONIC 9CM CVD (BLADE) ×3 IMPLANT
STAPLER VISISTAT 35W (STAPLE) ×3 IMPLANT
SUT CHROMIC 4 0 PS 2 18 (SUTURE) ×6 IMPLANT
SUT ETHILON 3 0 PS 1 (SUTURE) ×3 IMPLANT
SUT SILK 3 0 REEL (SUTURE) ×3 IMPLANT
SUT SILK 4 0 REEL (SUTURE) ×3 IMPLANT
TOWEL OR 17X24 6PK STRL BLUE (TOWEL DISPOSABLE) ×3 IMPLANT
TRAY ENT MC OR (CUSTOM PROCEDURE TRAY) ×3 IMPLANT

## 2016-08-03 NOTE — Anesthesia Preprocedure Evaluation (Addendum)
Anesthesia Evaluation  Patient identified by MRN, date of birth, ID band Patient awake    Reviewed: Allergy & Precautions, NPO status , Patient's Chart, lab work & pertinent test results, reviewed documented beta blocker date and time   History of Anesthesia Complications (+) PONV  Airway Mallampati: I  TM Distance: >3 FB Neck ROM: Full    Dental  (+) Teeth Intact, Dental Advisory Given   Pulmonary    Pulmonary exam normal        Cardiovascular Normal cardiovascular exam     Neuro/Psych Rare vertigo    GI/Hepatic   Endo/Other    Renal/GU      Musculoskeletal   Abdominal   Peds  Hematology   Anesthesia Other Findings   Reproductive/Obstetrics                            Anesthesia Physical Anesthesia Plan  ASA: II  Anesthesia Plan: General   Post-op Pain Management:    Induction: Intravenous  Airway Management Planned: Oral ETT  Additional Equipment:   Intra-op Plan:   Post-operative Plan: Extubation in OR  Informed Consent: I have reviewed the patients History and Physical, chart, labs and discussed the procedure including the risks, benefits and alternatives for the proposed anesthesia with the patient or authorized representative who has indicated his/her understanding and acceptance.     Plan Discussed with: CRNA and Surgeon  Anesthesia Plan Comments:         Anesthesia Quick Evaluation

## 2016-08-03 NOTE — Op Note (Signed)
OPERATIVE REPORT  DATE OF SURGERY: 08/03/2016  PATIENT:  Cleotilde Neer,  45 y.o. 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  COUNTS:  Correct  PROCEDURE DETAILS: The patient was taken to the operating room and placed on the operating table in the supine position. A shoulder roll was placed beneath the shoulder blades and the neck was extended. The neck was prepped and draped in a standard fashion. A low collar transverse incision was outlined with a marking pen and was incised with electrocautery . Dissection was continued down through the platysma layer.  Self-retaining retractors were used throughout the case.  The midline fascia was divided. The strap muscles were reflected off of the left lobe of the thyroid. There were multiple nodules identified including 2 large cystic nodules and to firm solid feeling nodules. The superior pole was dissected first, identifying the superior vasculature separately, and dividing between clamps using the or. Dissection then carried down to the middle thyroid vein which was treated in a similar fashion. Inferior vasculature was also ligated using the Harmonic scalpel. 4-0 silk ties were used in several places in addition if they vessels were larger caliber. An inferior parathyroid was identified. This was preserved with its blood supply. The recurrent nerve was identified and preserved. The gland was dissected off of the trachea. The isthmus was felt to be on the right side of the midline. The isthmus was divided using the Harmonic dissector. The left side was sent for pathologic evaluation with frozen section analysis.  The drain was exited through separate stab incision on the right side.  The drain was secured in place with a nylon suture. The midline fascia was reapproximated with interrupted chromic suture. The platysma layer was also reapproximated with interrupted chromic suture. A running subcuticular closure was accomplished. Dermabond was used on the skin. The drain was charged. The patient was awakened, extubated and transferred to recovery in stable condition.   PATIENT DISPOSITION:  To PACU, stable

## 2016-08-03 NOTE — Interval H&P Note (Signed)
History and Physical Interval Note:  08/03/2016 7:15 AM  Connie Randall  has presented today for surgery, with the diagnosis of Thyroid Mass  The various methods of treatment have been discussed with the patient and family. After consideration of risks, benefits and other options for treatment, the patient has consented to  Procedure(s) with comments: THYROIDECTOMY (N/A) - Left thyroid lobectomy with frozen section with possible total thryoidectomy as a surgical intervention .  The patient's history has been reviewed, patient examined, no change in status, stable for surgery.  I have reviewed the patient's chart and labs.  Questions were answered to the patient's satisfaction.     [name]

## 2016-08-03 NOTE — Progress Notes (Signed)
Patient ID: Connie Randall, female   DOB: 1971/03/23, 45 y.o.   MRN: [id number]  Doing well, no complaints. AF VSS NAD.  Normal voice. Neck incision clean and intact, no fluid collection, drain functioning. A/P: s/p thyroid lobectomy Observe overnight.  Drain in place.  HLIV.

## 2016-08-03 NOTE — Discharge Instructions (Signed)
You may shower and use soap and water. Do not use any creams, oils or ointment. ° °

## 2016-08-03 NOTE — H&P (View-Only) (Signed)
Otolaryngology Office Note  HPI:   Connie Randall is a 45 y.o. female who presents as a new Patient.   Referring Provider: self  Chief complaint: sinus.  HPI: Here for sinus problems. She was having some postnasal drip and some facial pain and pressure. She saw her dentist who recommended root canal. The endodontically said that her teeth were fine and suggested that she may have a sinus problem. A few weeks ago she developed acute onset of vertigo with nausea and vomiting that lasted a few days and has been gradually getting better. She never had any change in her hearing. She was diagnosed with sinusitis and fluid in her years. She doesn't have any nasal symptoms. She does drink about 32 ounces of iced tea daily and eats a fair amount of chocolate and peppermint. She had a small growth on her upper gingival surface removed twice in the past 6 years. He doesn't really cause her any symptoms except if she brushes her teeth sometimes it bleeds.  PMH/Meds/All/SocHx/FamHx/ROS:   History reviewed. No pertinent past medical history.  Past Surgical History:  Procedure Laterality Date  . etopic pregnancy  . ivf  . WISDOM TOOTH EXTRACTION   No family history of bleeding disorders, wound healing problems or difficulty with anesthesia.   Social History   Social History  . Marital status: Married  Spouse name: N/A  . Number of children: N/A  . Years of education: N/A   Occupational History  . Not on file.   Social History Main Topics  . Smoking status: Never Smoker  . Smokeless tobacco: Never Used  . Alcohol use Not on file  . Drug use: Not on file  . Sexual activity: Not on file   Other Topics Concern  . Not on file   Social History Narrative  . No narrative on file   No current outpatient prescriptions on file.  A complete ROS was performed with pertinent positives/negatives noted in the HPI. The remainder of the ROS are negative.   Physical Exam:   BP 120/80  Ht 1.626 m  (5\' 4" )  Wt 82.1 kg (181 lb)  BMI 31.07 kg/m2  General: Healthy and alert, in no distress, breathing easily. Normal affect. In a pleasant mood. Head: Normocephalic, atraumatic. No masses, or scars. Eyes: Pupils are equal, and reactive to light. Vision is grossly intact. No spontaneous or gaze nystagmus. Ears: Ear canals are clear. Tympanic membranes are intact, with normal landmarks and the middle ears are clear and healthy. Hearing: Grossly normal. Nose. Nasal cavities are clear with healthy mucosa, no polyps or exudate.Airways are patent. Face: No masses or scars, facial nerve function is symmetric. Oral Cavity: Small benign appearing papilloma left upper gingival mucosa.No other mucosal abnormalities are noted. Tongue with normal mobility. Dentition appears healthy. Oropharynx: Tonsils are symmetric. There are no mucosal masses identified. Tongue base appears normal and healthy. Larynx/Hypopharynx: deferred Neck: No palpable masses, no cervical adenopathy,  Small thyroid nodule palpable left side. Neuro: Cranial nerves II-XII will normal function. Balance: Normal gate. Other findings: none.  Independent Review of Additional Tests or Records:  none  Procedures:  none  Impression & Plans:  There is evidence of right maxillary sinus disease but it is unlikely this is related to most of her other symptoms. The vertigo is acute vestibular neuronitis and is unrelated to any of the other problems. This will continue to improve. The postnasal drainage and throat symptoms or reflux related. I'm not sure of this source of  her facial pain and pressure although I would recommend that we treat this sinusitis with 2 weeks of Augmentin and will have her followup in 3-4 weeks. Recommend thyroid ultrasound.

## 2016-08-03 NOTE — Progress Notes (Signed)
Dr Conrad  here to see pt-aware of bp's 150-160's/ 80's.  OK to tx pt to floor.

## 2016-08-03 NOTE — Progress Notes (Signed)
Lunch relief by MA Lui Bellis RN 

## 2016-08-03 NOTE — Anesthesia Procedure Notes (Signed)
Procedure Name: Intubation Date/Time: 08/03/2016 7:43 AM Performed by: Terrill Mohr Pre-anesthesia Checklist: Patient identified, Emergency Drugs available, Suction available and Patient being monitored Patient Re-evaluated:Patient Re-evaluated prior to inductionOxygen Delivery Method: Circle system utilized Preoxygenation: Pre-oxygenation with 100% oxygen Intubation Type: IV induction Ventilation: Mask ventilation without difficulty Laryngoscope Size: Mac and 3 Grade View: Grade II Tube type: Oral Tube size: 7.5 mm Number of attempts: 1 Airway Equipment and Method: Stylet Placement Confirmation: ETT inserted through vocal cords under direct vision,  positive ETCO2 and breath sounds checked- equal and bilateral Secured at: 22 (cm at teeth) cm Tube secured with: Tape Dental Injury: Teeth and Oropharynx as per pre-operative assessment

## 2016-08-03 NOTE — Anesthesia Postprocedure Evaluation (Signed)
Anesthesia Post Note  Patient: Connie Randall  Procedure(s) Performed: Procedure(s) (LRB): THYROIDECTOMY (Left)  Patient location during evaluation: PACU Anesthesia Type: General Level of consciousness: awake and alert Pain management: pain level controlled Vital Signs Assessment: post-procedure vital signs reviewed and stable Respiratory status: spontaneous breathing, nonlabored ventilation, respiratory function stable and patient connected to nasal cannula oxygen Cardiovascular status: blood pressure returned to baseline and stable Postop Assessment: no signs of nausea or vomiting Anesthetic complications: no    Last Vitals:  Vitals:   08/03/16 1130 08/03/16 1157  BP: (!) 166/81 (!) 164/83  Pulse: (!) 51 60  Resp: 17 18  Temp: 36.7 C 36.7 C    Last Pain:  Vitals:   08/03/16 1157  TempSrc: Oral  PainSc:                  Mazie Fencl DAVID

## 2016-08-03 NOTE — Transfer of Care (Signed)
Immediate Anesthesia Transfer of Care Note  Patient: Connie Randall  Procedure(s) Performed: Procedure(s) with comments: THYROIDECTOMY (Left) - Left thyroid lobectomy with frozen section  Patient Location: PACU  Anesthesia Type:General  Level of Consciousness: awake and patient cooperative  Airway & Oxygen Therapy: Patient Spontanous Breathing and Patient connected to nasal cannula oxygen  Post-op Assessment: Report given to RN, Post -op Vital signs reviewed and stable and Patient moving all extremities  Post vital signs: Reviewed and stable  Last Vitals:  Vitals:   08/03/16 0609 08/03/16 0925  BP: 126/72   Pulse: (!) 53   Resp: 20   Temp: 36.7 C (P) 36.5 C    Last Pain:  Vitals:   08/03/16 0609  TempSrc: Oral         Complications: No apparent anesthesia complications

## 2016-08-04 DIAGNOSIS — C73 Malignant neoplasm of thyroid gland: Secondary | ICD-10-CM | POA: Diagnosis not present

## 2016-08-04 NOTE — Discharge Planning (Addendum)
AVS and rx given to patient who verbalizes understanding. Will dc to private car home with all personal belongings, accompanied by family member. Dc at 0900.

## 2016-08-04 NOTE — Discharge Summary (Signed)
Physician Discharge Summary  Patient ID: Connie Randall MRN: [id number] DOB/AGE: Aug 05, 1971 45 y.o.  Admit date: 08/03/2016 Discharge date: 08/04/2016  Admission Diagnoses: Thyroid nodule  Discharge Diagnoses: same Active Problems:   S/P thyroidectomy   Discharged Condition: good  Hospital Course: 44 year old female with thyroid nodule presented for surgical management.  See operative note.  She was observed overnight and had no specific problems.  Pain was mild.  On POD 1, the drain was removed and she was felt stable for discharge.  Consults: None  Significant Diagnostic Studies: None  Treatments: surgery: Thyroid lobectomy  Discharge Exam: Blood pressure 135/71, pulse 63, temperature 98.3 F (36.8 C), temperature source Oral, resp. rate 18, height 5\' 4"  (1.626 m), weight 91 kg (200 lb 11 oz), last menstrual period 07/30/2016, SpO2 100 %. General appearance: alert, cooperative, no distress and normal voice Neck: thyroid incision clean and intact, no fluid collection, drain removed  Disposition: 01-Home or Self Care  Discharge Instructions    Diet - low sodium heart healthy    Complete by:  As directed   Discharge instructions    Complete by:  As directed   Avoid strenuous activity.  Do not apply ointment to incision.  OK to allow incision to get wet, gently pat dry.   Increase activity slowly    Complete by:  As directed       Medication List    TAKE these medications   HYDROcodone-acetaminophen 7.5-325 MG tablet Commonly known as:  NORCO Take 1 tablet by mouth every 6 (six) hours as needed for moderate pain.   promethazine 25 MG suppository Commonly known as:  PHENERGAN Place 1 suppository (25 mg total) rectally every 6 (six) hours as needed for nausea or vomiting.      Follow-up Information    ROSEN, JEFRY, MD. Schedule an appointment as soon as possible for a visit in 1 week(s).   Specialty:  Otolaryngology Contact information: [address] [place] [address] [phone]           Signed: Melida Quitter 08/04/2016, 8:01 AM

## 2016-08-05 ENCOUNTER — Encounter (HOSPITAL_COMMUNITY): Payer: Self-pay | Admitting: Otolaryngology

## 2016-08-23 ENCOUNTER — Ambulatory Visit: Payer: Self-pay | Admitting: Otolaryngology

## 2016-08-23 NOTE — H&P (Signed)
Connie Randall is an 45 y.o. female.   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 Here for completion thyroidectomy.  Past Medical History:  Diagnosis Date  . Anxiety   . PONV (postoperative nausea and vomiting)   . Thyroid mass     Past Surgical History:  Procedure Laterality Date  . DIAGNOSTIC LAPAROSCOPY    . DILATION AND CURETTAGE OF UTERUS    . DILATION AND EVACUATION  10/31/2011   Procedure: DILATATION AND EVACUATION (D&E);  Surgeon: Marcial Pacas;  Location: [facility];  Service: Gynecology;;  . egg retrieval    . THYROIDECTOMY  08/03/2016  . THYROIDECTOMY Left 08/03/2016   Procedure: THYROIDECTOMY;  Surgeon: Izora Gala, MD;  Location: [facility];  Service: ENT;  Laterality: Left;  Left thyroid lobectomy with frozen section  . TUBAL LIGATION      No family history on file. Social History:  reports that she has never smoked. She has never used smokeless tobacco. She reports that she drinks alcohol. She reports that she does not use drugs.  Allergies:  Allergies  Allergen Reactions  . No Known Allergies      (Not in a hospital admission)  No results found for this or any previous visit (from the past 48 hour(s)). No results found.  ROS: otherwise negative  Last menstrual period 07/30/2016.  PHYSICAL EXAM: Overall appearance:  Healthy appearing, in no distress Head:  Normocephalic, atraumatic. Ears: External auditory canals are clear; tympanic membranes are intact and the middle ears are free of any effusion. Nose: External nose is healthy in appearance. Internal nasal exam free of any lesions or obstruction. Oral Cavity/pharynx:  There are no mucosal lesions or masses identified. Hypopharynx/Larynx: no signs of any mucosal lesions or masses identified. Vocal cords move normally. Neuro:  No identifiable neurologic deficits. Neck: No palpable neck masses.Nicely  healing thyroidectomy scar.  Studies Reviewed: none    Assessment/Plan Thyroid carcinoma, proceed with completion thyroidectomy.  Peytin Dechert 08/23/2016, 8:59 AM

## 2016-09-11 NOTE — Pre-Procedure Instructions (Signed)
    DEANDREA LANDGREN  09/11/2016      [place] [address] Phone: [phone] Fax: [phone]  [facility] - [place], [place] - [address] AT [place] [address] [place] [address] Phone: [phone] Fax: [phone]    Your procedure is scheduled on Wednesday, September 19, 2016  [facility] to [facility] Admitting at 8:30 A.M.  Call this number if you have problems the morning of surgery:  [phone]   Remember:  Do not eat food or drink liquids after midnight Tuesday, September 18, 2016  Take these medicines the morning of surgery with A SIP OF WATER : None Stop taking Aspirin, vitamins, fish oil and herbal medications ( Livco). Do not take any NSAIDs ie: Ibuprofen, Advil, Naproxen, BC and Goody Powder; stop now.  Do not wear jewelry, make-up or nail polish.  Do not wear lotions, powders, or perfumes, or deoderant.  Do not shave 48 hours prior to surgery.    Do not bring valuables to the hospital.  [facility] is not responsible for any belongings or valuables. Contacts, dentures or bridgework may not be worn into surgery.  Leave your suitcase in the car.  After surgery it may be brought to your room. For patients admitted to the hospital, discharge time will be determined by your treatment team. Special instructions:Shower the night before surgery and the morning of surgery with CHG. Please read over the following fact sheets that you were given. Pain Booklet and Surgical Site Infection Prevention

## 2016-09-12 ENCOUNTER — Ambulatory Visit (HOSPITAL_COMMUNITY)
Admission: RE | Admit: 2016-09-12 | Discharge: 2016-09-12 | Disposition: A | Discharge status: Home / Self Care | Payer: 59 | Source: Ambulatory Visit | Attending: Anesthesiology | Admitting: Anesthesiology

## 2016-09-12 ENCOUNTER — Encounter (HOSPITAL_COMMUNITY): Payer: Self-pay

## 2016-09-12 ENCOUNTER — Encounter (HOSPITAL_COMMUNITY)
Admission: RE | Admit: 2016-09-12 | Discharge: 2016-09-12 | Disposition: A | Discharge status: Home / Self Care | Payer: 59 | Source: Ambulatory Visit | Attending: Otolaryngology | Admitting: Otolaryngology

## 2016-09-12 DIAGNOSIS — Z01818 Encounter for other preprocedural examination: Secondary | ICD-10-CM | POA: Diagnosis present

## 2016-09-12 DIAGNOSIS — E890 Postprocedural hypothyroidism: Secondary | ICD-10-CM | POA: Insufficient documentation

## 2016-09-12 DIAGNOSIS — Z9009 Acquired absence of other part of head and neck: Secondary | ICD-10-CM

## 2016-09-12 DIAGNOSIS — C73 Malignant neoplasm of thyroid gland: Secondary | ICD-10-CM | POA: Diagnosis not present

## 2016-09-12 DIAGNOSIS — Z01812 Encounter for preprocedural laboratory examination: Secondary | ICD-10-CM | POA: Insufficient documentation

## 2016-09-12 HISTORY — DX: Malignant neoplasm of thyroid gland: C73

## 2016-09-12 HISTORY — DX: Anemia, unspecified: D64.9

## 2016-09-12 LAB — CBC
HEMATOCRIT: 41 % (ref 36.0–46.0)
HEMOGLOBIN: 13.8 g/dL (ref 12.0–15.0)
MCH: 29 pg (ref 26.0–34.0)
MCHC: 33.7 g/dL (ref 30.0–36.0)
MCV: 86.1 fL (ref 78.0–100.0)
Platelets: 314 10*3/uL (ref 150–400)
RBC: 4.76 MIL/uL (ref 3.87–5.11)
RDW: 14.2 % (ref 11.5–15.5)
WBC: 7.1 10*3/uL (ref 4.0–10.5)

## 2016-09-12 LAB — BASIC METABOLIC PANEL
ANION GAP: 8 (ref 5–15)
BUN: 5 mg/dL — AB (ref 6–20)
CHLORIDE: 107 mmol/L (ref 101–111)
CO2: 22 mmol/L (ref 22–32)
Calcium: 8.7 mg/dL — ABNORMAL LOW (ref 8.9–10.3)
Creatinine, Ser: 0.91 mg/dL (ref 0.44–1.00)
GFR calc Af Amer: >60 mL/min (ref >60)
GFR calc non Af Amer: >60 mL/min (ref >60)
GLUCOSE: 87 mg/dL (ref 65–99)
POTASSIUM: 3.5 mmol/L (ref 3.5–5.1)
Sodium: 137 mmol/L (ref 135–145)

## 2016-09-12 LAB — HCG, SERUM, QUALITATIVE: Preg, Serum: NEGATIVE

## 2016-09-12 NOTE — Progress Notes (Signed)
Pt denies SOB, chest pain, and being under the care of a cardiologist. Pt denies having a stress test, echo and cardiac cath. Pt denies having an EKG within the last year. Pt denies having a chest x ray within the last month. Pt denies having any recent labs.

## 2016-09-19 ENCOUNTER — Ambulatory Visit (HOSPITAL_COMMUNITY): Payer: 59 | Admitting: Anesthesiology

## 2016-09-19 ENCOUNTER — Encounter (HOSPITAL_COMMUNITY)
Admission: RE | Disposition: A | Discharge status: Home / Self Care | Payer: Self-pay | Source: Ambulatory Visit | Attending: Otolaryngology

## 2016-09-19 ENCOUNTER — Encounter (HOSPITAL_COMMUNITY): Payer: Self-pay | Admitting: *Deleted

## 2016-09-19 ENCOUNTER — Observation Stay (HOSPITAL_COMMUNITY)
Admission: RE | Admit: 2016-09-19 | Discharge: 2016-09-20 | Disposition: A | Discharge status: Home / Self Care | Payer: 59 | Source: Ambulatory Visit | Attending: Otolaryngology | Admitting: Otolaryngology

## 2016-09-19 DIAGNOSIS — Z6836 Body mass index (BMI) 36.0-36.9, adult: Secondary | ICD-10-CM | POA: Diagnosis not present

## 2016-09-19 DIAGNOSIS — C73 Malignant neoplasm of thyroid gland: Secondary | ICD-10-CM | POA: Diagnosis present

## 2016-09-19 DIAGNOSIS — F419 Anxiety disorder, unspecified: Secondary | ICD-10-CM | POA: Insufficient documentation

## 2016-09-19 DIAGNOSIS — E669 Obesity, unspecified: Secondary | ICD-10-CM | POA: Diagnosis not present

## 2016-09-19 HISTORY — PX: THYROIDECTOMY: SHX17

## 2016-09-19 LAB — CALCIUM
CALCIUM: 8.3 mg/dL — AB (ref 8.9–10.3)
Calcium: 8.5 mg/dL — ABNORMAL LOW (ref 8.9–10.3)

## 2016-09-19 SURGERY — THYROIDECTOMY
Anesthesia: General | Site: Neck

## 2016-09-19 MED ORDER — LIDOCAINE 2% (20 MG/ML) 5 ML SYRINGE
INTRAMUSCULAR | Status: AC
  Filled 2016-09-19: qty 15

## 2016-09-19 MED ORDER — 0.9 % SODIUM CHLORIDE (POUR BTL) OPTIME
TOPICAL | Status: DC | PRN
  Administered 2016-09-19: 1000 mL

## 2016-09-19 MED ORDER — FERROUS SULFATE 325 (65 FE) MG PO TABS
325.0000 mg | ORAL_TABLET | Freq: Every day | ORAL | Status: DC
  Administered 2016-09-20: 325 mg via ORAL
  Filled 2016-09-19: qty 1

## 2016-09-19 MED ORDER — NEOSTIGMINE METHYLSULFATE 5 MG/5ML IV SOSY
PREFILLED_SYRINGE | INTRAVENOUS | Status: AC
  Filled 2016-09-19: qty 5

## 2016-09-19 MED ORDER — SUGAMMADEX SODIUM 200 MG/2ML IV SOLN
INTRAVENOUS | Status: DC | PRN
  Administered 2016-09-19: 200 mg via INTRAVENOUS

## 2016-09-19 MED ORDER — DEXTROSE-NACL 5-0.9 % IV SOLN
INTRAVENOUS | Status: DC
  Administered 2016-09-19: 13:00:00 via INTRAVENOUS

## 2016-09-19 MED ORDER — LIDOCAINE 2% (20 MG/ML) 5 ML SYRINGE
INTRAMUSCULAR | Status: AC
  Filled 2016-09-19: qty 5

## 2016-09-19 MED ORDER — LIDOCAINE HCL (CARDIAC) 20 MG/ML IV SOLN
INTRAVENOUS | Status: DC | PRN
  Administered 2016-09-19: 100 mg via INTRAVENOUS

## 2016-09-19 MED ORDER — PROMETHAZINE HCL 25 MG/ML IJ SOLN: 6.2500 mg | INTRAMUSCULAR | Status: DC | PRN

## 2016-09-19 MED ORDER — FENTANYL CITRATE (PF) 100 MCG/2ML IJ SOLN
INTRAMUSCULAR | Status: DC | PRN
  Administered 2016-09-19: 100 ug via INTRAVENOUS
  Administered 2016-09-19 (×2): 50 ug via INTRAVENOUS
  Administered 2016-09-19: 100 ug via INTRAVENOUS

## 2016-09-19 MED ORDER — FENTANYL CITRATE (PF) 100 MCG/2ML IJ SOLN
INTRAMUSCULAR | Status: AC
  Filled 2016-09-19: qty 2

## 2016-09-19 MED ORDER — MIDAZOLAM HCL 5 MG/5ML IJ SOLN
INTRAMUSCULAR | Status: DC | PRN
  Administered 2016-09-19: 2 mg via INTRAVENOUS

## 2016-09-19 MED ORDER — DEXAMETHASONE SODIUM PHOSPHATE 10 MG/ML IJ SOLN
INTRAMUSCULAR | Status: AC
  Filled 2016-09-19: qty 1

## 2016-09-19 MED ORDER — LIDOCAINE-EPINEPHRINE 1 %-1:100000 IJ SOLN
INTRAMUSCULAR | Status: AC
  Filled 2016-09-19: qty 1

## 2016-09-19 MED ORDER — PROMETHAZINE HCL 25 MG PO TABS
25.0000 mg | ORAL_TABLET | Freq: Four times a day (QID) | ORAL | Status: DC | PRN
  Administered 2016-09-19: 12.5 mg via ORAL
  Filled 2016-09-19: qty 1

## 2016-09-19 MED ORDER — SCOPOLAMINE 1 MG/3DAYS TD PT72
1.0000 | MEDICATED_PATCH | TRANSDERMAL | Status: DC
  Administered 2016-09-19: 1.5 mg via TRANSDERMAL

## 2016-09-19 MED ORDER — HYDROCODONE-ACETAMINOPHEN 7.5-325 MG PO TABS
1.0000 | ORAL_TABLET | Freq: Four times a day (QID) | ORAL | 0 refills | Status: DC | PRN
Start: 2016-09-19 — End: 2022-03-02

## 2016-09-19 MED ORDER — PROPOFOL 10 MG/ML IV BOLUS
INTRAVENOUS | Status: AC
  Filled 2016-09-19: qty 20

## 2016-09-19 MED ORDER — HYDROMORPHONE HCL 2 MG/ML IJ SOLN
INTRAMUSCULAR | Status: AC
  Filled 2016-09-19: qty 1

## 2016-09-19 MED ORDER — LEVOTHYROXINE SODIUM 100 MCG PO TABS: 100.0000 ug | ORAL_TABLET | Freq: Every day | ORAL | 6 refills | Status: DC

## 2016-09-19 MED ORDER — ONDANSETRON HCL 4 MG/2ML IJ SOLN
INTRAMUSCULAR | Status: DC | PRN
  Administered 2016-09-19: 4 mg via INTRAVENOUS

## 2016-09-19 MED ORDER — LEVOTHYROXINE SODIUM 100 MCG PO TABS
100.0000 ug | ORAL_TABLET | Freq: Every day | ORAL | Status: DC
  Administered 2016-09-20: 100 ug via ORAL
  Filled 2016-09-19: qty 1

## 2016-09-19 MED ORDER — CEFAZOLIN SODIUM-DEXTROSE 2-4 GM/100ML-% IV SOLN
INTRAVENOUS | Status: AC
  Filled 2016-09-19: qty 100

## 2016-09-19 MED ORDER — HYDROMORPHONE HCL 2 MG/ML IJ SOLN
0.2500 mg | INTRAMUSCULAR | Status: DC | PRN
  Administered 2016-09-19: 0.5 mg via INTRAVENOUS

## 2016-09-19 MED ORDER — CEFAZOLIN SODIUM-DEXTROSE 2-4 GM/100ML-% IV SOLN
2.0000 g | INTRAVENOUS | Status: AC
  Administered 2016-09-19: 2 g via INTRAVENOUS

## 2016-09-19 MED ORDER — ONDANSETRON HCL 4 MG/2ML IJ SOLN
INTRAMUSCULAR | Status: AC
  Filled 2016-09-19: qty 2

## 2016-09-19 MED ORDER — PROPOFOL 10 MG/ML IV BOLUS
INTRAVENOUS | Status: DC | PRN
  Administered 2016-09-19: 150 mg via INTRAVENOUS

## 2016-09-19 MED ORDER — SCOPOLAMINE 1 MG/3DAYS TD PT72
MEDICATED_PATCH | TRANSDERMAL | Status: AC
  Filled 2016-09-19: qty 1

## 2016-09-19 MED ORDER — MIDAZOLAM HCL 2 MG/2ML IJ SOLN
INTRAMUSCULAR | Status: AC
  Filled 2016-09-19: qty 2

## 2016-09-19 MED ORDER — HYDROCODONE-ACETAMINOPHEN 5-325 MG PO TABS: 1.0000 | ORAL_TABLET | ORAL | Status: DC | PRN

## 2016-09-19 MED ORDER — PROMETHAZINE HCL 25 MG RE SUPP: 25.0000 mg | Freq: Four times a day (QID) | RECTAL | Status: DC | PRN

## 2016-09-19 MED ORDER — GLYCOPYRROLATE 0.2 MG/ML IV SOSY
PREFILLED_SYRINGE | INTRAVENOUS | Status: AC
  Filled 2016-09-19: qty 6

## 2016-09-19 MED ORDER — DEXAMETHASONE SODIUM PHOSPHATE 10 MG/ML IJ SOLN
INTRAMUSCULAR | Status: DC | PRN
  Administered 2016-09-19: 10 mg via INTRAVENOUS

## 2016-09-19 MED ORDER — PROMETHAZINE HCL 25 MG RE SUPP: 25.0000 mg | Freq: Four times a day (QID) | RECTAL | 1 refills | Status: DC | PRN

## 2016-09-19 MED ORDER — LIDOCAINE HCL 4 % EX SOLN
CUTANEOUS | Status: DC | PRN
  Administered 2016-09-19: 3 mL via TOPICAL

## 2016-09-19 MED ORDER — LACTATED RINGERS IV SOLN
INTRAVENOUS | Status: DC
  Administered 2016-09-19 (×2): via INTRAVENOUS

## 2016-09-19 MED ORDER — ROCURONIUM BROMIDE 10 MG/ML (PF) SYRINGE
PREFILLED_SYRINGE | INTRAVENOUS | Status: AC
  Filled 2016-09-19: qty 10

## 2016-09-19 MED ORDER — FENTANYL CITRATE (PF) 100 MCG/2ML IJ SOLN
INTRAMUSCULAR | Status: AC
  Filled 2016-09-19: qty 4

## 2016-09-19 MED ORDER — IBUPROFEN 100 MG/5ML PO SUSP: 400.0000 mg | Freq: Four times a day (QID) | ORAL | Status: DC | PRN

## 2016-09-19 MED ORDER — ROCURONIUM BROMIDE 100 MG/10ML IV SOLN
INTRAVENOUS | Status: DC | PRN
  Administered 2016-09-19: 50 mg via INTRAVENOUS

## 2016-09-19 SURGICAL SUPPLY — 42 items
BLADE SURG 15 STRL LF DISP TIS (BLADE) IMPLANT
BLADE SURG 15 STRL SS (BLADE)
CANISTER SUCTION 2500CC (MISCELLANEOUS) ×3 IMPLANT
CLEANER TIP ELECTROSURG 2X2 (MISCELLANEOUS) ×3 IMPLANT
CONT SPEC 4OZ CLIKSEAL STRL BL (MISCELLANEOUS) IMPLANT
CORDS BIPOLAR (ELECTRODE) ×3 IMPLANT
COVER SURGICAL LIGHT HANDLE (MISCELLANEOUS) ×3 IMPLANT
DERMABOND ADVANCED (GAUZE/BANDAGES/DRESSINGS) ×2
DERMABOND ADVANCED .7 DNX12 (GAUZE/BANDAGES/DRESSINGS) ×1 IMPLANT
DRAIN HEMOVAC 7FR (DRAIN) ×3 IMPLANT
DRAIN SNY 10 ROU (WOUND CARE) IMPLANT
DRAPE PROXIMA HALF (DRAPES) ×3 IMPLANT
ELECT COATED BLADE 2.86 ST (ELECTRODE) ×3 IMPLANT
ELECT REM PT RETURN 9FT ADLT (ELECTROSURGICAL) ×3
ELECTRODE REM PT RTRN 9FT ADLT (ELECTROSURGICAL) ×1 IMPLANT
EVACUATOR SILICONE 100CC (DRAIN) ×3 IMPLANT
FORCEPS BIPOLAR SPETZLER 8 1.0 (NEUROSURGERY SUPPLIES) ×3 IMPLANT
GAUZE SPONGE 4X4 16PLY XRAY LF (GAUZE/BANDAGES/DRESSINGS) IMPLANT
GLOVE BIO SURGEON STRL SZ 6.5 (GLOVE) ×2 IMPLANT
GLOVE BIO SURGEONS STRL SZ 6.5 (GLOVE) ×1
GLOVE BIOGEL PI IND STRL 6.5 (GLOVE) ×1 IMPLANT
GLOVE BIOGEL PI IND STRL 7.5 (GLOVE) ×1 IMPLANT
GLOVE BIOGEL PI INDICATOR 6.5 (GLOVE) ×2
GLOVE BIOGEL PI INDICATOR 7.5 (GLOVE) ×2
GLOVE ECLIPSE 6.0 STRL STRAW (GLOVE) ×3 IMPLANT
GLOVE ECLIPSE 7.5 STRL STRAW (GLOVE) ×3 IMPLANT
GOWN STRL REUS W/ TWL LRG LVL3 (GOWN DISPOSABLE) ×2 IMPLANT
GOWN STRL REUS W/TWL LRG LVL3 (GOWN DISPOSABLE) ×4
KIT BASIN OR (CUSTOM PROCEDURE TRAY) ×3 IMPLANT
KIT ROOM TURNOVER OR (KITS) ×3 IMPLANT
NEEDLE 27GAX1X1/2 (NEEDLE) ×3 IMPLANT
NS IRRIG 1000ML POUR BTL (IV SOLUTION) ×3 IMPLANT
PAD ARMBOARD 7.5X6 YLW CONV (MISCELLANEOUS) ×6 IMPLANT
PENCIL FOOT CONTROL (ELECTRODE) ×3 IMPLANT
SHEARS HARMONIC 9CM CVD (BLADE) ×3 IMPLANT
STAPLER VISISTAT 35W (STAPLE) ×3 IMPLANT
SUT CHROMIC 4 0 PS 2 18 (SUTURE) ×3 IMPLANT
SUT ETHILON 3 0 PS 1 (SUTURE) ×3 IMPLANT
SUT SILK 3 0 REEL (SUTURE) IMPLANT
SUT SILK 4 0 REEL (SUTURE) ×3 IMPLANT
TOWEL OR 17X24 6PK STRL BLUE (TOWEL DISPOSABLE) ×3 IMPLANT
TRAY ENT MC OR (CUSTOM PROCEDURE TRAY) ×3 IMPLANT

## 2016-09-19 NOTE — Progress Notes (Signed)
09/19/2016 6:03 PM  Gustavus Messing [id number]  Post-Op check    Temp:  [97.6 F (36.4 C)-99.1 F (37.3 C)] 97.7 F (36.5 C) (10/18 1309) Pulse Rate:  [57-84] 60 (10/18 1309) Resp:  [16-23] 20 (10/18 1309) BP: (138-163)/(74-99) 145/85 (10/18 1309) SpO2:  [95 %-100 %] 100 % (10/18 1309) Weight:  [90.7 kg (200 lb)-96.8 kg (213 lb 6.5 oz)] 96.8 kg (213 lb 6.5 oz) (10/18 1309),     Intake/Output Summary (Last 24 hours) at 09/19/16 1803 Last data filed at 09/19/16 1634  Gross per 24 hour  Intake              800 ml  Output              855 ml  Net              -55 ml   Drain 25 ml  Results for orders placed or performed during the hospital encounter of 09/19/16 (from the past 24 hour(s))  Calcium     Status: Abnormal   Collection Time: 09/19/16  1:48 PM  Result Value Ref Range   Calcium 8.3 (L) 8.9 - 10.3 mg/dL    SUBJECTIVE:  Min pain.  N,V earlier.  Breathing well. Voice good.  Spont void  OBJECTIVE:  Awake, alert. Voice strong and clear.  Wound flat.  Drain functional  IMPRESSION:  Satisfactory check  PLAN:  Follow Ca++.    Jodi Marble

## 2016-09-19 NOTE — Progress Notes (Signed)
Pt arrived to 6n20 from PACU, deniews nausea/pain at this time. Oriented to room and surroundings, family at bedside

## 2016-09-19 NOTE — Op Note (Signed)
OPERATIVE REPORT  DATE OF SURGERY: 09/19/2016  PATIENT:  Connie Randall,  45 y.o. female  PRE-OPERATIVE DIAGNOSIS:  papilary carcinoma  POST-OPERATIVE DIAGNOSIS:  papilary carcinoma  PROCEDURE:  Procedure(s): COMPLETION THYROIDECTOMY  SURGEON:  Beckie Salts, MD  ASSISTANTS: Jolene Provost PA  ANESTHESIA:   General   EBL:  20 ml  DRAINS: 7 French round J-P  LOCAL MEDICATIONS USED:  None  SPECIMEN:  Right thyroid lobe  COUNTS:  Correct  PROCEDURE DETAILS: The patient was taken to the operating room and placed on the operating table in the supine position. A shoulder roll was placed beneath the shoulder blades and the neck was extended. The neck was prepped and draped in a standard fashion. The previous scar was incised with electrocautery. Dissection was continued down through the platysma layer.  Self-retaining retractors were used throughout the case.  The midline fascia was divided. The strap muscles were reflected laterally towards the right and the right thyroid lobe was retracted medially using Allis clamps. Careful dissection around the capsule of the gland was then accomplished starting with the superior pole. The harmonic dissector was used throughout the case. Bipolar cautery was used as needed as well. Dissection remained immediately on the capsule of the gland. The recurrent nerve was identified and preserved. A putative parathyroid gland was also identified and preserved. The lobe was removed off the trachea and sent for pathologic evaluation. Hemostasis was completed. The drain was exited through separate stab incision inferiorly.  The drain was secured in place with a nylon suture. The midline fascia was reapproximated with interrupted chromic suture. The platysma layer was also reapproximated with interrupted chromic suture. A running subcuticular closure was accomplished. Dermabond was used on the skin. The drain was charged. The patient was awakened, extubated and  transferred to recovery in stable condition.   PATIENT DISPOSITION:  To PACU, stable

## 2016-09-19 NOTE — Anesthesia Preprocedure Evaluation (Signed)
Anesthesia Evaluation  Patient identified by MRN, date of birth, ID band Patient awake    Reviewed: Allergy & Precautions, NPO status , Patient's Chart, lab work & pertinent test results  History of Anesthesia Complications (+) PONV and history of anesthetic complications  Airway Mallampati: I  TM Distance: >3 FB Neck ROM: Full    Dental  (+) Teeth Intact   Pulmonary neg pulmonary ROS,    breath sounds clear to auscultation       Cardiovascular  Rhythm:Regular Rate:Normal     Neuro/Psych negative neurological ROS     GI/Hepatic negative GI ROS, Neg liver ROS,   Endo/Other    Renal/GU negative Renal ROS     Musculoskeletal negative musculoskeletal ROS (+)   Abdominal (+) + obese,   Peds  Hematology negative hematology ROS (+)   Anesthesia Other Findings   Reproductive/Obstetrics                             Anesthesia Physical Anesthesia Plan  ASA: II  Anesthesia Plan: General   Post-op Pain Management:    Induction: Intravenous  Airway Management Planned: Oral ETT  Additional Equipment:   Intra-op Plan:   Post-operative Plan: Extubation in OR  Informed Consent: I have reviewed the patients History and Physical, chart, labs and discussed the procedure including the risks, benefits and alternatives for the proposed anesthesia with the patient or authorized representative who has indicated his/her understanding and acceptance.   Dental advisory given  Plan Discussed with: CRNA  Anesthesia Plan Comments:         Anesthesia Quick Evaluation

## 2016-09-19 NOTE — Interval H&P Note (Signed)
History and Physical Interval Note:  09/19/2016 9:59 AM  Connie Randall  has presented today for surgery, with the diagnosis of papilary carcinoma  The various methods of treatment have been discussed with the patient and family. After consideration of risks, benefits and other options for treatment, the patient has consented to  Procedure(s): COMPLETION THYROIDECTOMY (N/A) as a surgical intervention .  The patient's history has been reviewed, patient examined, no change in status, stable for surgery.  I have reviewed the patient's chart and labs.  Questions were answered to the patient's satisfaction.     Brittnie Lewey

## 2016-09-19 NOTE — Discharge Instructions (Signed)
You may shower and use soap and water. Do not use any creams, oils or ointment. ° °

## 2016-09-19 NOTE — Transfer of Care (Signed)
Immediate Anesthesia Transfer of Care Note  Patient: Connie Randall  Procedure(s) Performed: Procedure(s): COMPLETION THYROIDECTOMY (N/A)  Patient Location: PACU  Anesthesia Type:General  Level of Consciousness: awake, alert , oriented and patient cooperative  Airway & Oxygen Therapy: Patient Spontanous Breathing and Patient connected to nasal cannula oxygen  Post-op Assessment: Report given to RN and Post -op Vital signs reviewed and stable  Post vital signs: Reviewed and stable  Last Vitals:  Vitals:   09/19/16 0915 09/19/16 1151  BP: (!) 145/74 (!) 161/99  Pulse: 61 84  Resp: 18 16  Temp: 37.3 C 36.4 C    Last Pain:  Vitals:   09/19/16 1151  TempSrc:   PainSc: Asleep      Patients Stated Pain Goal: 8 ([id number])  Complications: No apparent anesthesia complications

## 2016-09-19 NOTE — H&P (View-Only) (Signed)
Connie Randall is an 45 y.o. female.   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 Here for completion thyroidectomy.  Past Medical History:  Diagnosis Date  . Anxiety   . PONV (postoperative nausea and vomiting)   . Thyroid mass     Past Surgical History:  Procedure Laterality Date  . DIAGNOSTIC LAPAROSCOPY    . DILATION AND CURETTAGE OF UTERUS    . DILATION AND EVACUATION  10/31/2011   Procedure: DILATATION AND EVACUATION (D&E);  Surgeon: Marcial Pacas;  Location: [facility];  Service: Gynecology;;  . egg retrieval    . THYROIDECTOMY  08/03/2016  . THYROIDECTOMY Left 08/03/2016   Procedure: THYROIDECTOMY;  Surgeon: Izora Gala, MD;  Location: [facility];  Service: ENT;  Laterality: Left;  Left thyroid lobectomy with frozen section  . TUBAL LIGATION      No family history on file. Social History:  reports that she has never smoked. She has never used smokeless tobacco. She reports that she drinks alcohol. She reports that she does not use drugs.  Allergies:  Allergies  Allergen Reactions  . No Known Allergies      (Not in a hospital admission)  No results found for this or any previous visit (from the past 48 hour(s)). No results found.  ROS: otherwise negative  Last menstrual period 07/30/2016.  PHYSICAL EXAM: Overall appearance:  Healthy appearing, in no distress Head:  Normocephalic, atraumatic. Ears: External auditory canals are clear; tympanic membranes are intact and the middle ears are free of any effusion. Nose: External nose is healthy in appearance. Internal nasal exam free of any lesions or obstruction. Oral Cavity/pharynx:  There are no mucosal lesions or masses identified. Hypopharynx/Larynx: no signs of any mucosal lesions or masses identified. Vocal cords move normally. Neuro:  No identifiable neurologic deficits. Neck: No palpable neck masses.Nicely  healing thyroidectomy scar.  Studies Reviewed: none    Assessment/Plan Thyroid carcinoma, proceed with completion thyroidectomy.  Connie Randall 08/23/2016, 8:59 AM

## 2016-09-20 ENCOUNTER — Encounter (HOSPITAL_COMMUNITY): Payer: Self-pay | Admitting: Otolaryngology

## 2016-09-20 DIAGNOSIS — C73 Malignant neoplasm of thyroid gland: Secondary | ICD-10-CM | POA: Diagnosis not present

## 2016-09-20 LAB — CALCIUM: CALCIUM: 8.1 mg/dL — AB (ref 8.9–10.3)

## 2016-09-20 NOTE — Discharge Summary (Signed)
  Physician Discharge Summary  Patient ID: Connie Randall MRN: [id number] DOB/AGE: 1970/12/15 45 y.o.  Admit date: 09/19/2016 Discharge date: 09/20/2016  Admission Diagnoses:Thyroid cancer  Discharge Diagnoses:  Active Problems:   Thyroid cancer [facility])   Discharged Condition: good  Hospital Course: no complications.  Consults: none  Significant Diagnostic Studies: none  Treatments: surgery: completion thyroidectomy  Discharge Exam: Blood pressure 136/77, pulse (!) 55, temperature 98.5 F (36.9 C), temperature source Oral, resp. rate 18, height 5\' 4"  (1.626 m), weight 96.8 kg (213 lb 6.5 oz), last menstrual period 08/19/2016, SpO2 98 %. PHYSICAL EXAM: Normal voice, incision excellent, JP removed.  Disposition: 01-Home or Self Care  Discharge Instructions    Diet - low sodium heart healthy    Complete by:  As directed    Increase activity slowly    Complete by:  As directed        Medication List    TAKE these medications   ferrous sulfate 325 (65 FE) MG tablet Take 325 mg by mouth daily with breakfast.   HYDROcodone-acetaminophen 7.5-325 MG tablet Commonly known as:  NORCO Take 1 tablet by mouth every 6 (six) hours as needed for moderate pain.   levothyroxine 100 MCG tablet Commonly known as:  SYNTHROID Take 1 tablet (100 mcg total) by mouth daily.   OVER THE COUNTER MEDICATION Take 2 tablets by mouth daily. LIVCO (Schisandra, Rosemary and Milk Thistle combination)   promethazine 25 MG suppository Commonly known as:  PHENERGAN Place 1 suppository (25 mg total) rectally every 6 (six) hours as needed for nausea or vomiting.      Follow-up Information    Zsofia Prout, MD. Schedule an appointment as soon as possible for a visit in 1 week.   Specialty:  Otolaryngology Contact information: [address] [place] [address] [phone]           Signed: Izora Gala 09/20/2016, 8:40 AM

## 2016-09-23 NOTE — Anesthesia Postprocedure Evaluation (Signed)
Anesthesia Post Note  Patient: Connie Randall  Procedure(s) Performed: Procedure(s) (LRB): COMPLETION THYROIDECTOMY (N/A)  Patient location during evaluation: PACU Anesthesia Type: General Level of consciousness: awake and alert Pain management: pain level controlled Vital Signs Assessment: post-procedure vital signs reviewed and stable Respiratory status: spontaneous breathing, nonlabored ventilation, respiratory function stable and patient connected to nasal cannula oxygen Cardiovascular status: blood pressure returned to baseline and stable Postop Assessment: no signs of nausea or vomiting Anesthetic complications: no    Last Vitals:  Vitals:   09/20/16 0205 09/20/16 0518  BP: 139/68 136/77  Pulse: (!) 59 (!) 55  Resp: 18 18  Temp: 36.8 C 36.9 C    Last Pain:  Vitals:   09/20/16 0518  TempSrc: Oral  PainSc:                  [name],JAMES TERRILL

## 2017-02-05 IMAGING — US US THYROID
1 series · 13 of 25 positions shown · non-contrast
Comparison: None.

CLINICAL DATA: Neck swelling on physical exam

EXAM:
THYROID ULTRASOUND
TECHNIQUE: Ultrasound examination of the thyroid gland and adjacent soft
tissues was performed.

[Series 1: us thyroid · 0.10mm/px · 13 of 75 slices shown]
[im 1/75]
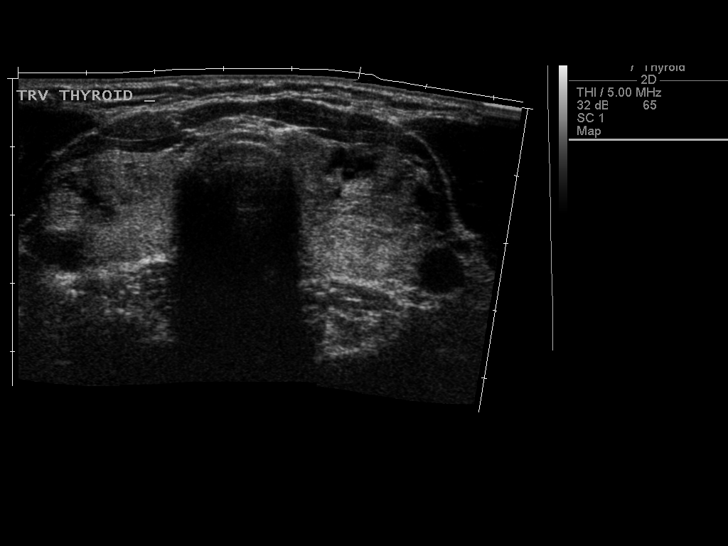
[im 7/75]
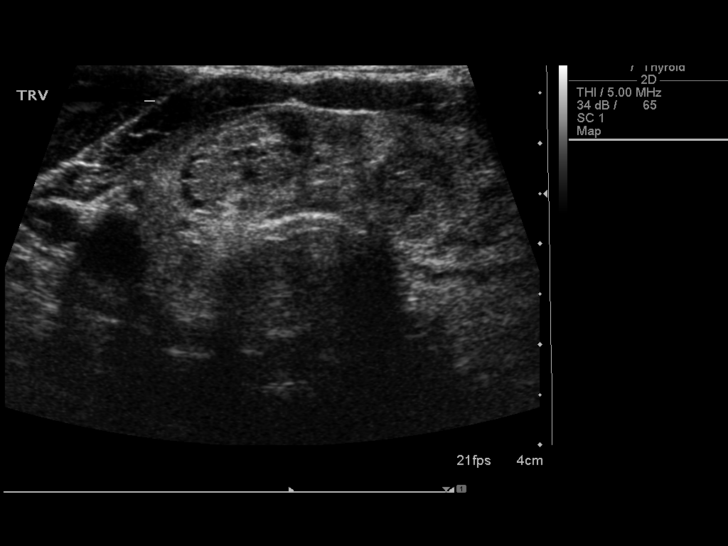
[im 13/75]
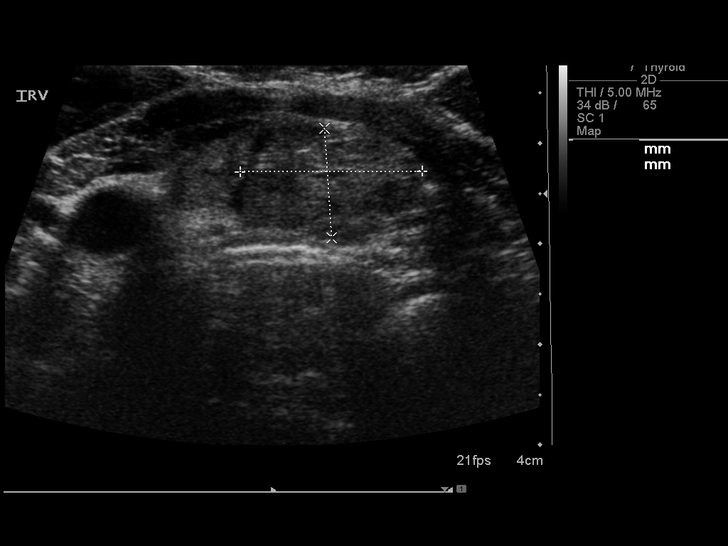
[im 19/75]
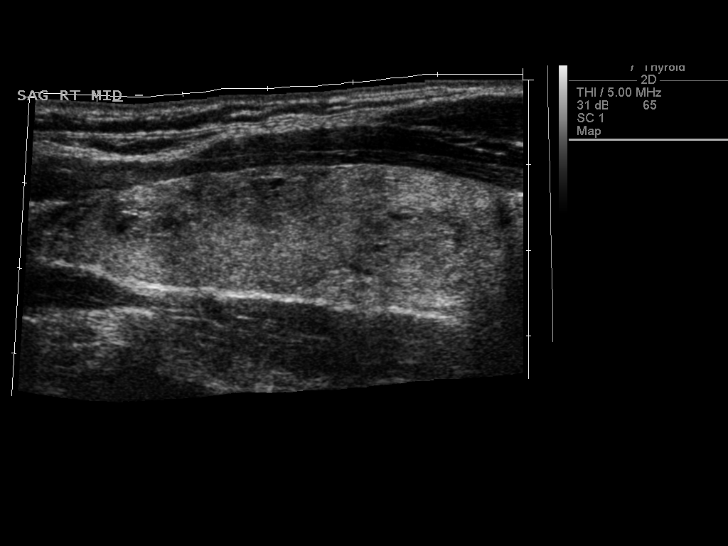
[im 25/75]
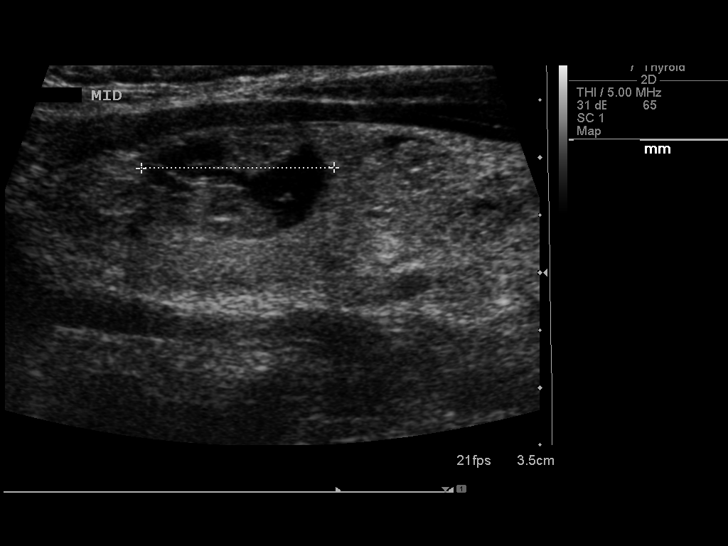
[im 31/75]
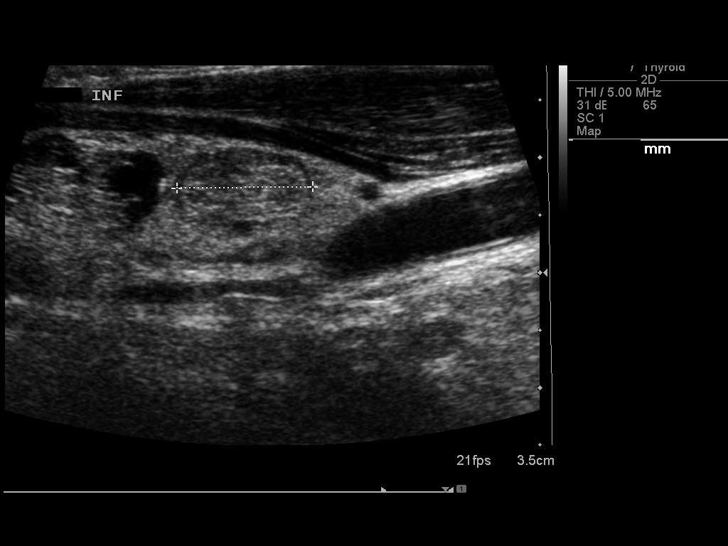
[im 38/75]
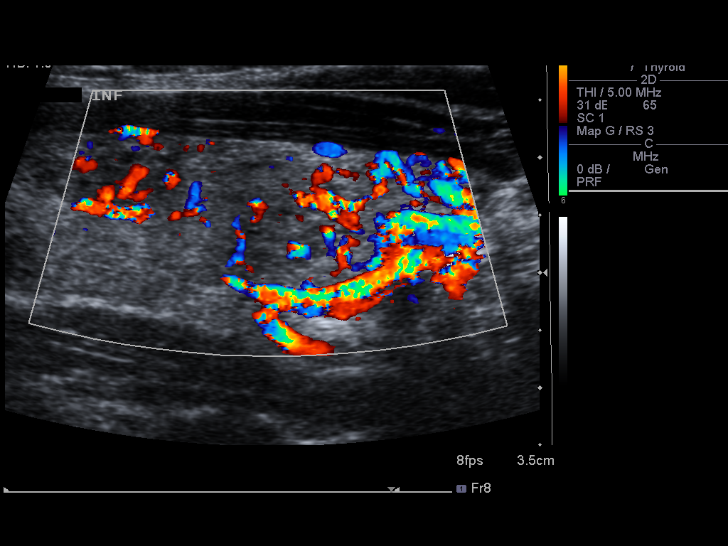
[im 44/75]
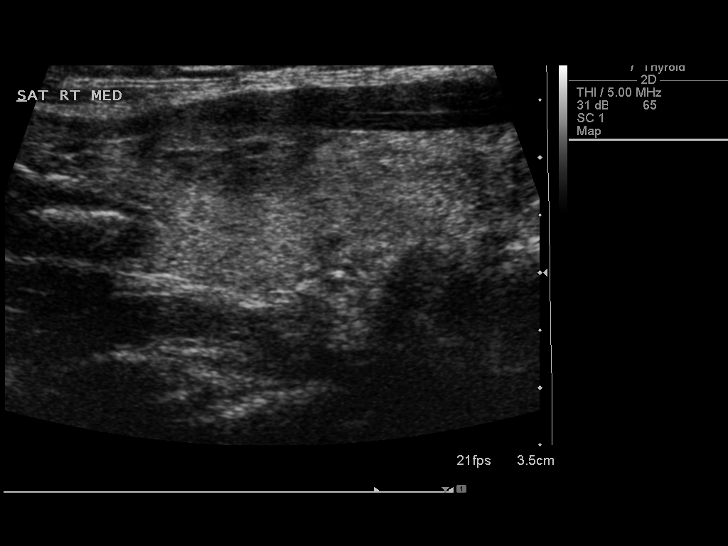
[im 50/75]
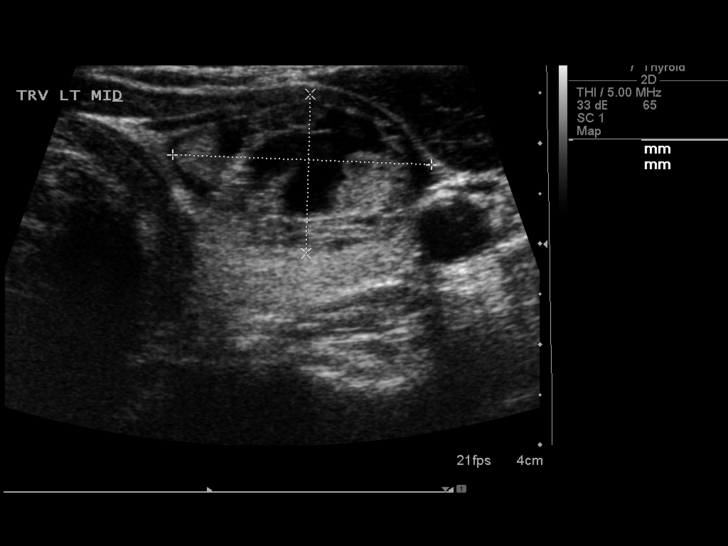
[im 56/75]
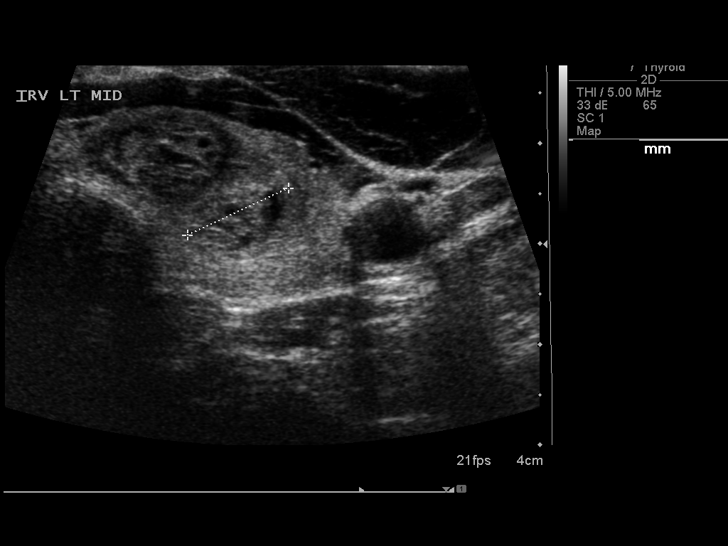
[im 62/75]
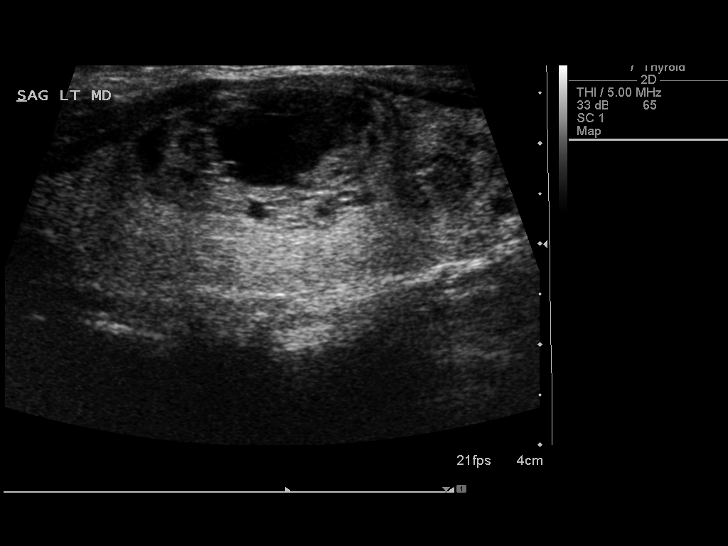
[im 68/75]
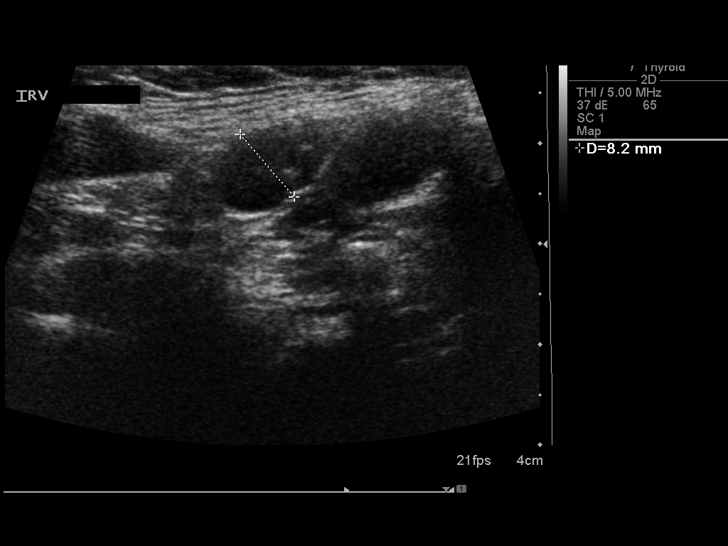
[im 75/75]
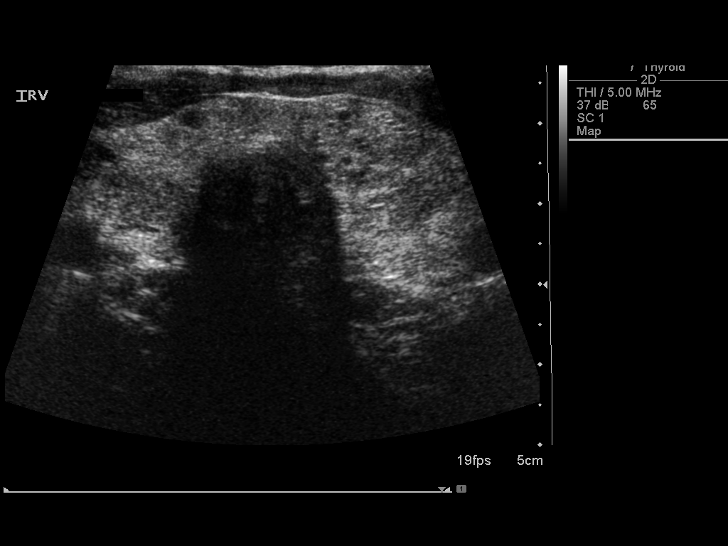

[13 of 25 positions shown; findings below may reference images not displayed]

FINDINGS: Right thyroid lobe

Measurements: 5.7 x 1.6 x 2.2 cm. Multiple nodules. Largest 1.8 x 1
x 1.7 cm mostly solid, mid lobe. Inferiorly, a 1.2 x 0.8 x 1.2 cm
complex solid nodule. deep to this, a 1.1 x 1.3 cm solid nodule.

Left thyroid lobe

Measurements: 6.7 x 2.6 x 2.1 cm. Complex cystic 2.6 x 1.6 x 2.8 cm
nodule, mid lobe. Adjacent 1.1 cm complex mostly solid nodule
inferiorly, and a 0.8 cm solid nodule in the inferior pole.

Isthmus

Thickness: 1.1 cm. Multiple nodules. Complex 1.5 x 0.9 x 1.7 cm
mostly solid nodule, right of midline. Mostly solid 1.8 x 1.1 x
cm nodule, left of midline. Just cephalad to this is a 1.1 cm solid
nodule. There is a 0.8 cm nodule, superior left.

Lymphadenopathy

None visualized.
IMPRESSION: 1. Thyromegaly with bilateral nodules. 8 mm mid right, 2.8 cm mid
left, and isthmic nodules meet consensus criteria for biopsy.
Ultrasound-guided fine needle aspiration should be considered, as
per the consensus statement: Management of Thyroid Nodules Detected
at US: Society of Radiologists in Ultrasound Consensus Conference

## 2018-02-04 ENCOUNTER — Other Ambulatory Visit: Payer: Self-pay | Admitting: Endocrinology

## 2018-02-04 DIAGNOSIS — C73 Malignant neoplasm of thyroid gland: Secondary | ICD-10-CM

## 2018-02-10 ENCOUNTER — Ambulatory Visit
Admission: RE | Admit: 2018-02-10 | Discharge: 2018-02-10 | Disposition: A | Discharge status: Home / Self Care | Payer: 59 | Source: Ambulatory Visit | Attending: Endocrinology | Admitting: Endocrinology

## 2018-02-10 DIAGNOSIS — C73 Malignant neoplasm of thyroid gland: Secondary | ICD-10-CM

## 2018-02-14 ENCOUNTER — Other Ambulatory Visit: Payer: Self-pay | Admitting: Endocrinology

## 2018-02-14 DIAGNOSIS — E041 Nontoxic single thyroid nodule: Secondary | ICD-10-CM

## 2018-03-06 ENCOUNTER — Other Ambulatory Visit: Payer: 59

## 2019-02-04 ENCOUNTER — Other Ambulatory Visit (HOSPITAL_COMMUNITY): Payer: Self-pay | Admitting: Endocrinology

## 2019-02-04 DIAGNOSIS — C73 Malignant neoplasm of thyroid gland: Secondary | ICD-10-CM

## 2019-02-16 ENCOUNTER — Other Ambulatory Visit (HOSPITAL_COMMUNITY): Payer: 59

## 2019-02-17 ENCOUNTER — Encounter (HOSPITAL_COMMUNITY): Payer: 59

## 2019-02-18 ENCOUNTER — Other Ambulatory Visit (HOSPITAL_COMMUNITY): Payer: 59

## 2019-02-20 ENCOUNTER — Other Ambulatory Visit (HOSPITAL_COMMUNITY): Payer: 59

## 2019-03-02 ENCOUNTER — Other Ambulatory Visit (HOSPITAL_COMMUNITY): Payer: 59

## 2019-03-03 ENCOUNTER — Other Ambulatory Visit (HOSPITAL_COMMUNITY): Payer: 59

## 2019-03-04 ENCOUNTER — Other Ambulatory Visit (HOSPITAL_COMMUNITY): Payer: 59

## 2019-03-06 ENCOUNTER — Other Ambulatory Visit (HOSPITAL_COMMUNITY): Payer: 59

## 2020-12-08 DIAGNOSIS — Z3141 Encounter for fertility testing: Secondary | ICD-10-CM | POA: Diagnosis not present

## 2020-12-08 DIAGNOSIS — Z0000 Encounter for general adult medical examination without abnormal findings: Secondary | ICD-10-CM | POA: Diagnosis not present

## 2020-12-14 DIAGNOSIS — Z114 Encounter for screening for human immunodeficiency virus [HIV]: Secondary | ICD-10-CM | POA: Diagnosis not present

## 2020-12-14 DIAGNOSIS — Z789 Other specified health status: Secondary | ICD-10-CM | POA: Diagnosis not present

## 2020-12-14 DIAGNOSIS — Z113 Encounter for screening for infections with a predominantly sexual mode of transmission: Secondary | ICD-10-CM | POA: Diagnosis not present

## 2020-12-22 DIAGNOSIS — N978 Female infertility of other origin: Secondary | ICD-10-CM | POA: Diagnosis not present

## 2020-12-22 DIAGNOSIS — N96 Recurrent pregnancy loss: Secondary | ICD-10-CM | POA: Diagnosis not present

## 2020-12-23 DIAGNOSIS — Z3141 Encounter for fertility testing: Secondary | ICD-10-CM | POA: Diagnosis not present

## 2021-01-13 DIAGNOSIS — N978 Female infertility of other origin: Secondary | ICD-10-CM | POA: Diagnosis not present

## 2021-01-24 DIAGNOSIS — N978 Female infertility of other origin: Secondary | ICD-10-CM | POA: Diagnosis not present

## 2021-02-07 DIAGNOSIS — Z3200 Encounter for pregnancy test, result unknown: Secondary | ICD-10-CM | POA: Diagnosis not present

## 2021-02-09 DIAGNOSIS — N912 Amenorrhea, unspecified: Secondary | ICD-10-CM | POA: Diagnosis not present

## 2021-02-09 DIAGNOSIS — Z0000 Encounter for general adult medical examination without abnormal findings: Secondary | ICD-10-CM | POA: Diagnosis not present

## 2021-02-16 DIAGNOSIS — N912 Amenorrhea, unspecified: Secondary | ICD-10-CM | POA: Diagnosis not present

## 2021-02-23 DIAGNOSIS — O99211 Obesity complicating pregnancy, first trimester: Secondary | ICD-10-CM | POA: Diagnosis not present

## 2021-02-23 DIAGNOSIS — O09811 Supervision of pregnancy resulting from assisted reproductive technology, first trimester: Secondary | ICD-10-CM | POA: Diagnosis not present

## 2021-02-23 DIAGNOSIS — E669 Obesity, unspecified: Secondary | ICD-10-CM | POA: Diagnosis not present

## 2021-02-23 DIAGNOSIS — Z3A01 Less than 8 weeks gestation of pregnancy: Secondary | ICD-10-CM | POA: Diagnosis not present

## 2021-03-20 DIAGNOSIS — Z124 Encounter for screening for malignant neoplasm of cervix: Secondary | ICD-10-CM | POA: Diagnosis not present

## 2021-03-20 DIAGNOSIS — Z3480 Encounter for supervision of other normal pregnancy, unspecified trimester: Secondary | ICD-10-CM | POA: Diagnosis not present

## 2021-03-20 DIAGNOSIS — O10019 Pre-existing essential hypertension complicating pregnancy, unspecified trimester: Secondary | ICD-10-CM | POA: Diagnosis not present

## 2021-03-20 DIAGNOSIS — E038 Other specified hypothyroidism: Secondary | ICD-10-CM | POA: Diagnosis not present

## 2021-03-20 DIAGNOSIS — O09521 Supervision of elderly multigravida, first trimester: Secondary | ICD-10-CM | POA: Diagnosis not present

## 2021-03-20 DIAGNOSIS — O09811 Supervision of pregnancy resulting from assisted reproductive technology, first trimester: Secondary | ICD-10-CM | POA: Diagnosis not present

## 2021-03-20 DIAGNOSIS — O09511 Supervision of elderly primigravida, first trimester: Secondary | ICD-10-CM | POA: Diagnosis not present

## 2021-03-20 LAB — OB RESULTS CONSOLE HEPATITIS B SURFACE ANTIGEN: Hepatitis B Surface Ag: NEGATIVE

## 2021-03-20 LAB — OB RESULTS CONSOLE RUBELLA ANTIBODY, IGM: Rubella: IMMUNE

## 2021-03-20 LAB — OB RESULTS CONSOLE ABO/RH: RH Type: POSITIVE

## 2021-03-20 LAB — OB RESULTS CONSOLE HIV ANTIBODY (ROUTINE TESTING): HIV: NONREACTIVE

## 2021-03-20 LAB — OB RESULTS CONSOLE ANTIBODY SCREEN: Antibody Screen: NEGATIVE

## 2021-03-20 LAB — OB RESULTS CONSOLE RPR: RPR: NONREACTIVE

## 2021-05-08 DIAGNOSIS — Z369 Encounter for antenatal screening, unspecified: Secondary | ICD-10-CM | POA: Diagnosis not present

## 2021-05-08 DIAGNOSIS — Z3482 Encounter for supervision of other normal pregnancy, second trimester: Secondary | ICD-10-CM | POA: Diagnosis not present

## 2021-05-08 DIAGNOSIS — E039 Hypothyroidism, unspecified: Secondary | ICD-10-CM | POA: Diagnosis not present

## 2021-05-11 DIAGNOSIS — O99212 Obesity complicating pregnancy, second trimester: Secondary | ICD-10-CM | POA: Diagnosis not present

## 2021-05-11 DIAGNOSIS — E038 Other specified hypothyroidism: Secondary | ICD-10-CM | POA: Diagnosis not present

## 2021-05-11 DIAGNOSIS — Z803 Family history of malignant neoplasm of breast: Secondary | ICD-10-CM | POA: Diagnosis not present

## 2021-05-11 DIAGNOSIS — Z85850 Personal history of malignant neoplasm of thyroid: Secondary | ICD-10-CM | POA: Diagnosis not present

## 2021-05-25 DIAGNOSIS — Z3A19 19 weeks gestation of pregnancy: Secondary | ICD-10-CM | POA: Diagnosis not present

## 2021-05-25 DIAGNOSIS — O09812 Supervision of pregnancy resulting from assisted reproductive technology, second trimester: Secondary | ICD-10-CM | POA: Diagnosis not present

## 2021-05-25 DIAGNOSIS — Z3689 Encounter for other specified antenatal screening: Secondary | ICD-10-CM | POA: Diagnosis not present

## 2021-06-07 DIAGNOSIS — E890 Postprocedural hypothyroidism: Secondary | ICD-10-CM | POA: Diagnosis not present

## 2021-06-07 DIAGNOSIS — Z369 Encounter for antenatal screening, unspecified: Secondary | ICD-10-CM | POA: Diagnosis not present

## 2021-06-08 DIAGNOSIS — E890 Postprocedural hypothyroidism: Secondary | ICD-10-CM | POA: Diagnosis not present

## 2021-06-08 DIAGNOSIS — O0992 Supervision of high risk pregnancy, unspecified, second trimester: Secondary | ICD-10-CM | POA: Diagnosis not present

## 2021-06-08 DIAGNOSIS — O09512 Supervision of elderly primigravida, second trimester: Secondary | ICD-10-CM | POA: Diagnosis not present

## 2021-06-08 DIAGNOSIS — O09812 Supervision of pregnancy resulting from assisted reproductive technology, second trimester: Secondary | ICD-10-CM | POA: Diagnosis not present

## 2021-06-08 DIAGNOSIS — Z3A21 21 weeks gestation of pregnancy: Secondary | ICD-10-CM | POA: Diagnosis not present

## 2021-07-04 DIAGNOSIS — Z85850 Personal history of malignant neoplasm of thyroid: Secondary | ICD-10-CM | POA: Diagnosis not present

## 2021-07-04 DIAGNOSIS — Z79890 Hormone replacement therapy (postmenopausal): Secondary | ICD-10-CM | POA: Diagnosis not present

## 2021-07-04 DIAGNOSIS — O0992 Supervision of high risk pregnancy, unspecified, second trimester: Secondary | ICD-10-CM | POA: Diagnosis not present

## 2021-07-04 DIAGNOSIS — Z3689 Encounter for other specified antenatal screening: Secondary | ICD-10-CM | POA: Diagnosis not present

## 2021-07-04 DIAGNOSIS — Z3A25 25 weeks gestation of pregnancy: Secondary | ICD-10-CM | POA: Diagnosis not present

## 2021-07-04 DIAGNOSIS — O99212 Obesity complicating pregnancy, second trimester: Secondary | ICD-10-CM | POA: Diagnosis not present

## 2021-07-04 DIAGNOSIS — Z634 Disappearance and death of family member: Secondary | ICD-10-CM | POA: Diagnosis not present

## 2021-07-04 DIAGNOSIS — O09512 Supervision of elderly primigravida, second trimester: Secondary | ICD-10-CM | POA: Diagnosis not present

## 2021-07-04 DIAGNOSIS — Z7982 Long term (current) use of aspirin: Secondary | ICD-10-CM | POA: Diagnosis not present

## 2021-07-04 DIAGNOSIS — O09522 Supervision of elderly multigravida, second trimester: Secondary | ICD-10-CM | POA: Diagnosis not present

## 2021-07-04 DIAGNOSIS — E669 Obesity, unspecified: Secondary | ICD-10-CM | POA: Diagnosis not present

## 2021-07-04 DIAGNOSIS — O09812 Supervision of pregnancy resulting from assisted reproductive technology, second trimester: Secondary | ICD-10-CM | POA: Diagnosis not present

## 2021-07-05 DIAGNOSIS — Z369 Encounter for antenatal screening, unspecified: Secondary | ICD-10-CM | POA: Diagnosis not present

## 2021-07-05 DIAGNOSIS — Z3480 Encounter for supervision of other normal pregnancy, unspecified trimester: Secondary | ICD-10-CM | POA: Diagnosis not present

## 2021-08-02 DIAGNOSIS — Z23 Encounter for immunization: Secondary | ICD-10-CM | POA: Diagnosis not present

## 2021-08-02 DIAGNOSIS — O99283 Endocrine, nutritional and metabolic diseases complicating pregnancy, third trimester: Secondary | ICD-10-CM | POA: Diagnosis not present

## 2021-08-02 DIAGNOSIS — E079 Disorder of thyroid, unspecified: Secondary | ICD-10-CM | POA: Diagnosis not present

## 2021-08-02 DIAGNOSIS — E890 Postprocedural hypothyroidism: Secondary | ICD-10-CM | POA: Diagnosis not present

## 2021-08-03 DIAGNOSIS — O0993 Supervision of high risk pregnancy, unspecified, third trimester: Secondary | ICD-10-CM | POA: Diagnosis not present

## 2021-08-03 DIAGNOSIS — E890 Postprocedural hypothyroidism: Secondary | ICD-10-CM | POA: Diagnosis not present

## 2021-08-03 DIAGNOSIS — Z85850 Personal history of malignant neoplasm of thyroid: Secondary | ICD-10-CM | POA: Diagnosis not present

## 2021-08-03 DIAGNOSIS — O09812 Supervision of pregnancy resulting from assisted reproductive technology, second trimester: Secondary | ICD-10-CM | POA: Diagnosis not present

## 2021-08-03 DIAGNOSIS — Z3A25 25 weeks gestation of pregnancy: Secondary | ICD-10-CM | POA: Diagnosis not present

## 2021-08-03 DIAGNOSIS — Z3A29 29 weeks gestation of pregnancy: Secondary | ICD-10-CM | POA: Diagnosis not present

## 2021-08-03 DIAGNOSIS — E669 Obesity, unspecified: Secondary | ICD-10-CM | POA: Diagnosis not present

## 2021-08-03 DIAGNOSIS — O09512 Supervision of elderly primigravida, second trimester: Secondary | ICD-10-CM | POA: Diagnosis not present

## 2021-08-03 DIAGNOSIS — Z3689 Encounter for other specified antenatal screening: Secondary | ICD-10-CM | POA: Diagnosis not present

## 2021-08-03 DIAGNOSIS — O09523 Supervision of elderly multigravida, third trimester: Secondary | ICD-10-CM | POA: Diagnosis not present

## 2021-08-16 DIAGNOSIS — Z369 Encounter for antenatal screening, unspecified: Secondary | ICD-10-CM | POA: Diagnosis not present

## 2021-09-01 DIAGNOSIS — O0993 Supervision of high risk pregnancy, unspecified, third trimester: Secondary | ICD-10-CM | POA: Diagnosis not present

## 2021-09-01 DIAGNOSIS — Z3A33 33 weeks gestation of pregnancy: Secondary | ICD-10-CM | POA: Diagnosis not present

## 2021-09-01 DIAGNOSIS — E032 Hypothyroidism due to medicaments and other exogenous substances: Secondary | ICD-10-CM | POA: Diagnosis not present

## 2021-09-01 DIAGNOSIS — Z3689 Encounter for other specified antenatal screening: Secondary | ICD-10-CM | POA: Diagnosis not present

## 2021-09-01 DIAGNOSIS — O09812 Supervision of pregnancy resulting from assisted reproductive technology, second trimester: Secondary | ICD-10-CM | POA: Diagnosis not present

## 2021-09-01 DIAGNOSIS — O09512 Supervision of elderly primigravida, second trimester: Secondary | ICD-10-CM | POA: Diagnosis not present

## 2021-09-06 DIAGNOSIS — Z369 Encounter for antenatal screening, unspecified: Secondary | ICD-10-CM | POA: Diagnosis not present

## 2021-09-18 DIAGNOSIS — O09513 Supervision of elderly primigravida, third trimester: Secondary | ICD-10-CM | POA: Diagnosis not present

## 2021-09-21 DIAGNOSIS — Z3480 Encounter for supervision of other normal pregnancy, unspecified trimester: Secondary | ICD-10-CM | POA: Diagnosis not present

## 2021-09-21 DIAGNOSIS — O09813 Supervision of pregnancy resulting from assisted reproductive technology, third trimester: Secondary | ICD-10-CM | POA: Diagnosis not present

## 2021-09-21 DIAGNOSIS — Z3A36 36 weeks gestation of pregnancy: Secondary | ICD-10-CM | POA: Diagnosis not present

## 2021-09-21 DIAGNOSIS — O09523 Supervision of elderly multigravida, third trimester: Secondary | ICD-10-CM | POA: Diagnosis not present

## 2021-09-25 ENCOUNTER — Encounter (HOSPITAL_COMMUNITY): Payer: Self-pay

## 2021-09-25 ENCOUNTER — Other Ambulatory Visit: Payer: Self-pay

## 2021-09-25 ENCOUNTER — Inpatient Hospital Stay (HOSPITAL_COMMUNITY)
Admission: AD | Admit: 2021-09-25 | Discharge: 2021-09-25 | Disposition: A | Discharge status: Home / Self Care | Payer: 59 | Attending: Obstetrics | Admitting: Obstetrics

## 2021-09-25 DIAGNOSIS — Z3689 Encounter for other specified antenatal screening: Secondary | ICD-10-CM | POA: Diagnosis not present

## 2021-09-25 DIAGNOSIS — Z79899 Other long term (current) drug therapy: Secondary | ICD-10-CM | POA: Diagnosis not present

## 2021-09-25 DIAGNOSIS — Z3A36 36 weeks gestation of pregnancy: Secondary | ICD-10-CM | POA: Insufficient documentation

## 2021-09-25 DIAGNOSIS — O99513 Diseases of the respiratory system complicating pregnancy, third trimester: Secondary | ICD-10-CM | POA: Diagnosis not present

## 2021-09-25 NOTE — MAU Provider Note (Signed)
History     CSN: [id number]  Arrival date and time: 09/25/21 1530   Event Date/Time   First Provider Initiated Contact with Patient 09/25/21 1621      Chief Complaint  Patient presents with   Non-stress Test   HPI  Ms.Connie Randall is a 50 y.o. female G42P0030 @ [redacted]w[redacted]d here for fetal monitoring. She was in the office today and they were not able to identify a baseline. + fetal movement, no complaints.   OB History     Gravida  4   Para      Term      Preterm      AB  3   Living         SAB  2   IAB      Ectopic  1   Multiple      Live Births              Past Medical History:  Diagnosis Date   Anemia    Anxiety    situational anxiety only no treatment   PONV (postoperative nausea and vomiting)    Thyroid carcinoma ([place])    Thyroid mass     Past Surgical History:  Procedure Laterality Date   DIAGNOSTIC LAPAROSCOPY     DILATION AND CURETTAGE OF UTERUS     DILATION AND EVACUATION  10/31/2011   Procedure: DILATATION AND EVACUATION (D&E);  Surgeon: Marcial Pacas;  Location: [facility];  Service: Gynecology;;   egg retrieval     THYROIDECTOMY  08/03/2016   THYROIDECTOMY Left 08/03/2016   Procedure: THYROIDECTOMY;  Surgeon: Izora Gala, MD;  Location: [facility];  Service: ENT;  Laterality: Left;  Left thyroid lobectomy with frozen section   THYROIDECTOMY  09/19/2016   THYROIDECTOMY N/A 09/19/2016   Procedure: COMPLETION THYROIDECTOMY;  Surgeon: Izora Gala, MD;  Location: [facility];  Service: ENT;  Laterality: N/A;   TUBAL LIGATION      Family History  Problem Relation Age of Onset   Hypertension Mother    Hypercholesterolemia Mother    Cancer Mother    Hypertension Father    Hypercholesterolemia Father     Social History   Tobacco Use   Smoking status: Never   Smokeless tobacco: Never  Substance Use Topics   Alcohol use: Yes    Comment: rare   Drug use: No    Allergies:  Allergies  Allergen Reactions   No Known Allergies      Medications Prior to Admission  Medication Sig Dispense Refill Last Dose   ferrous sulfate 325 (65 FE) MG tablet Take 325 mg by mouth daily with breakfast.      HYDROcodone-acetaminophen (NORCO) 7.5-325 MG tablet Take 1 tablet by mouth every 6 (six) hours as needed for moderate pain. 20 tablet 0    levothyroxine (SYNTHROID) 100 MCG tablet Take 1 tablet (100 mcg total) by mouth daily. 30 tablet 6    OVER THE COUNTER MEDICATION Take 2 tablets by mouth daily. LIVCO (Schisandra, Rosemary and Milk Thistle combination)      promethazine (PHENERGAN) 25 MG suppository Place 1 suppository (25 mg total) rectally every 6 (six) hours as needed for nausea or vomiting. 12 suppository 1    No results found for this or any previous visit (from the past 48 hour(s)).   Review of Systems  Gastrointestinal:  Negative for abdominal pain.  Genitourinary:  Negative for vaginal bleeding and vaginal discharge.  Physical Exam   Blood pressure 130/82, pulse 95,  temperature 97.8 F (36.6 C), resp. rate 18, height 5\' 4"  (1.626 m), weight 92.5 kg.  Physical Exam Constitutional:      General: She is not in acute distress.    Appearance: Normal appearance. She is not ill-appearing, toxic-appearing or diaphoretic.  Skin:    General: Skin is warm.  Neurological:     Mental Status: She is alert and oriented to person, place, and time.  Psychiatric:        Behavior: Behavior normal.   Fetal Tracing: Baseline: 125 bpm Variability: Moderate  Accelerations: 15x15 Decelerations: None Toco: None  MAU Course  Procedures None  MDM  NST reactive, Category 1   Assessment and Plan   A:  1. NST (non-stress test) reactive   2. [redacted] weeks gestation of pregnancy      P:  Discharge home F/u with OB as scheduled Return to MAU if symptoms worsen   Jastin Fore, Artist Pais, NP 09/25/2021 5:18 PM

## 2021-09-25 NOTE — MAU Note (Signed)
Pt sent from office unable to complete NST. Baby was very active and they could not get a good tracing. Pt denies any pain or cramping and reports good fetal movement.

## 2021-09-28 DIAGNOSIS — O09513 Supervision of elderly primigravida, third trimester: Secondary | ICD-10-CM | POA: Diagnosis not present

## 2021-09-29 DIAGNOSIS — O09812 Supervision of pregnancy resulting from assisted reproductive technology, second trimester: Secondary | ICD-10-CM | POA: Diagnosis not present

## 2021-09-29 DIAGNOSIS — Q950 Balanced translocation and insertion in normal individual: Secondary | ICD-10-CM | POA: Diagnosis not present

## 2021-09-29 DIAGNOSIS — O09813 Supervision of pregnancy resulting from assisted reproductive technology, third trimester: Secondary | ICD-10-CM | POA: Diagnosis not present

## 2021-09-29 DIAGNOSIS — Z3689 Encounter for other specified antenatal screening: Secondary | ICD-10-CM | POA: Diagnosis not present

## 2021-09-29 DIAGNOSIS — O09293 Supervision of pregnancy with other poor reproductive or obstetric history, third trimester: Secondary | ICD-10-CM | POA: Diagnosis not present

## 2021-09-29 DIAGNOSIS — O09523 Supervision of elderly multigravida, third trimester: Secondary | ICD-10-CM | POA: Diagnosis not present

## 2021-09-29 DIAGNOSIS — O09512 Supervision of elderly primigravida, second trimester: Secondary | ICD-10-CM | POA: Diagnosis not present

## 2021-09-29 DIAGNOSIS — Z3A37 37 weeks gestation of pregnancy: Secondary | ICD-10-CM | POA: Diagnosis not present

## 2021-10-02 DIAGNOSIS — O09523 Supervision of elderly multigravida, third trimester: Secondary | ICD-10-CM | POA: Diagnosis not present

## 2021-10-02 DIAGNOSIS — O288 Other abnormal findings on antenatal screening of mother: Secondary | ICD-10-CM | POA: Diagnosis not present

## 2021-10-02 DIAGNOSIS — O99213 Obesity complicating pregnancy, third trimester: Secondary | ICD-10-CM | POA: Diagnosis not present

## 2021-10-02 DIAGNOSIS — Z3A37 37 weeks gestation of pregnancy: Secondary | ICD-10-CM | POA: Diagnosis not present

## 2021-10-04 DIAGNOSIS — O09513 Supervision of elderly primigravida, third trimester: Secondary | ICD-10-CM | POA: Diagnosis not present

## 2021-10-04 DIAGNOSIS — Z3A38 38 weeks gestation of pregnancy: Secondary | ICD-10-CM | POA: Diagnosis not present

## 2021-10-04 DIAGNOSIS — O09813 Supervision of pregnancy resulting from assisted reproductive technology, third trimester: Secondary | ICD-10-CM | POA: Diagnosis not present

## 2021-10-05 ENCOUNTER — Telehealth (HOSPITAL_COMMUNITY): Payer: Self-pay | Admitting: *Deleted

## 2021-10-05 NOTE — Telephone Encounter (Signed)
Preadmission screen  

## 2021-10-06 ENCOUNTER — Encounter (HOSPITAL_COMMUNITY): Payer: Self-pay

## 2021-10-06 ENCOUNTER — Encounter (HOSPITAL_COMMUNITY): Payer: Self-pay | Admitting: *Deleted

## 2021-10-09 ENCOUNTER — Other Ambulatory Visit: Payer: Self-pay | Admitting: Obstetrics and Gynecology

## 2021-10-09 DIAGNOSIS — Z3A38 38 weeks gestation of pregnancy: Secondary | ICD-10-CM | POA: Diagnosis not present

## 2021-10-09 DIAGNOSIS — O09513 Supervision of elderly primigravida, third trimester: Secondary | ICD-10-CM | POA: Diagnosis not present

## 2021-10-09 LAB — SARS CORONAVIRUS 2 (TAT 6-24 HRS): SARS Coronavirus 2: NEGATIVE

## 2021-10-10 ENCOUNTER — Other Ambulatory Visit: Payer: Self-pay

## 2021-10-11 ENCOUNTER — Encounter (HOSPITAL_COMMUNITY)
Admission: AD | Disposition: A | Discharge status: Home / Self Care | Payer: Self-pay | Source: Home / Self Care | Attending: Obstetrics

## 2021-10-11 ENCOUNTER — Encounter (HOSPITAL_COMMUNITY): Payer: Self-pay | Admitting: Obstetrics and Gynecology

## 2021-10-11 ENCOUNTER — Inpatient Hospital Stay (HOSPITAL_COMMUNITY): Payer: BC Managed Care – PPO | Admitting: Anesthesiology

## 2021-10-11 ENCOUNTER — Inpatient Hospital Stay (HOSPITAL_COMMUNITY)
Admission: AD | Admit: 2021-10-11 | Discharge: 2021-10-21 | DRG: 787 | Disposition: A | Discharge status: Home / Self Care | Payer: BC Managed Care – PPO | Attending: Obstetrics | Admitting: Obstetrics

## 2021-10-11 ENCOUNTER — Inpatient Hospital Stay (HOSPITAL_COMMUNITY): Payer: BC Managed Care – PPO

## 2021-10-11 DIAGNOSIS — O9962 Diseases of the digestive system complicating childbirth: Secondary | ICD-10-CM | POA: Diagnosis not present

## 2021-10-11 DIAGNOSIS — K8000 Calculus of gallbladder with acute cholecystitis without obstruction: Secondary | ICD-10-CM | POA: Diagnosis present

## 2021-10-11 DIAGNOSIS — Z20822 Contact with and (suspected) exposure to covid-19: Secondary | ICD-10-CM | POA: Diagnosis present

## 2021-10-11 DIAGNOSIS — K9189 Other postprocedural complications and disorders of digestive system: Secondary | ICD-10-CM | POA: Diagnosis not present

## 2021-10-11 DIAGNOSIS — O09513 Supervision of elderly primigravida, third trimester: Secondary | ICD-10-CM | POA: Diagnosis not present

## 2021-10-11 DIAGNOSIS — D72819 Decreased white blood cell count, unspecified: Secondary | ICD-10-CM | POA: Diagnosis not present

## 2021-10-11 DIAGNOSIS — O9902 Anemia complicating childbirth: Secondary | ICD-10-CM | POA: Diagnosis not present

## 2021-10-11 DIAGNOSIS — K82A1 Gangrene of gallbladder in cholecystitis: Secondary | ICD-10-CM | POA: Diagnosis present

## 2021-10-11 DIAGNOSIS — E039 Hypothyroidism, unspecified: Secondary | ICD-10-CM | POA: Diagnosis not present

## 2021-10-11 DIAGNOSIS — D649 Anemia, unspecified: Secondary | ICD-10-CM | POA: Diagnosis not present

## 2021-10-11 DIAGNOSIS — K810 Acute cholecystitis: Secondary | ICD-10-CM | POA: Diagnosis not present

## 2021-10-11 DIAGNOSIS — O26893 Other specified pregnancy related conditions, third trimester: Secondary | ICD-10-CM | POA: Diagnosis present

## 2021-10-11 DIAGNOSIS — K567 Ileus, unspecified: Secondary | ICD-10-CM | POA: Diagnosis not present

## 2021-10-11 DIAGNOSIS — Z85850 Personal history of malignant neoplasm of thyroid: Secondary | ICD-10-CM

## 2021-10-11 DIAGNOSIS — O134 Gestational [pregnancy-induced] hypertension without significant proteinuria, complicating childbirth: Secondary | ICD-10-CM | POA: Diagnosis not present

## 2021-10-11 DIAGNOSIS — D638 Anemia in other chronic diseases classified elsewhere: Secondary | ICD-10-CM | POA: Diagnosis not present

## 2021-10-11 DIAGNOSIS — Z3A39 39 weeks gestation of pregnancy: Secondary | ICD-10-CM | POA: Diagnosis not present

## 2021-10-11 DIAGNOSIS — R140 Abdominal distension (gaseous): Secondary | ICD-10-CM | POA: Diagnosis not present

## 2021-10-11 DIAGNOSIS — K828 Other specified diseases of gallbladder: Secondary | ICD-10-CM

## 2021-10-11 DIAGNOSIS — O09519 Supervision of elderly primigravida, unspecified trimester: Secondary | ICD-10-CM

## 2021-10-11 DIAGNOSIS — O09813 Supervision of pregnancy resulting from assisted reproductive technology, third trimester: Secondary | ICD-10-CM | POA: Diagnosis not present

## 2021-10-11 DIAGNOSIS — D72829 Elevated white blood cell count, unspecified: Secondary | ICD-10-CM | POA: Diagnosis not present

## 2021-10-11 LAB — CBC
HCT: 34.9 % — ABNORMAL LOW (ref 36.0–46.0)
HCT: 37.6 % (ref 36.0–46.0)
Hemoglobin: 11.6 g/dL — ABNORMAL LOW (ref 12.0–15.0)
Hemoglobin: 12.5 g/dL (ref 12.0–15.0)
MCH: 29.7 pg (ref 26.0–34.0)
MCH: 30 pg (ref 26.0–34.0)
MCHC: 33.2 g/dL (ref 30.0–36.0)
MCHC: 33.2 g/dL (ref 30.0–36.0)
MCV: 89.5 fL (ref 80.0–100.0)
MCV: 90.4 fL (ref 80.0–100.0)
Platelets: 265 10*3/uL (ref 150–400)
Platelets: 263 10*3/uL (ref 150–400)
RBC: 3.9 MIL/uL (ref 3.87–5.11)
RBC: 4.16 MIL/uL (ref 3.87–5.11)
RDW: 13.5 % (ref 11.5–15.5)
RDW: 13.3 % (ref 11.5–15.5)
WBC: 9.2 10*3/uL (ref 4.0–10.5)
WBC: 13.2 10*3/uL — ABNORMAL HIGH (ref 4.0–10.5)
nRBC: 0 % (ref 0.0–0.2)
nRBC: 0 % (ref 0.0–0.2)

## 2021-10-11 LAB — COMPREHENSIVE METABOLIC PANEL
ALT: 12 U/L (ref 0–44)
AST: 19 U/L (ref 15–41)
Albumin: 2.6 g/dL — ABNORMAL LOW (ref 3.5–5.0)
Alkaline Phosphatase: 176 U/L — ABNORMAL HIGH (ref 38–126)
Anion gap: 10 (ref 5–15)
BUN: 6 mg/dL (ref 6–20)
CO2: 20 mmol/L — ABNORMAL LOW (ref 22–32)
Calcium: 8.6 mg/dL — ABNORMAL LOW (ref 8.9–10.3)
Chloride: 103 mmol/L (ref 98–111)
Creatinine, Ser: 0.78 mg/dL (ref 0.44–1.00)
GFR, Estimated: >60 mL/min (ref >60)
Glucose, Bld: 77 mg/dL (ref 70–99)
Potassium: 4.2 mmol/L (ref 3.5–5.1)
Sodium: 133 mmol/L — ABNORMAL LOW (ref 135–145)
Total Bilirubin: 1 mg/dL (ref 0.3–1.2)
Total Protein: 6.6 g/dL (ref 6.5–8.1)

## 2021-10-11 LAB — RPR: RPR Ser Ql: NONREACTIVE

## 2021-10-11 LAB — TYPE AND SCREEN
ABO/RH(D): O POS
Antibody Screen: NEGATIVE

## 2021-10-11 SURGERY — Surgical Case
Anesthesia: Epidural

## 2021-10-11 MED ORDER — KETOROLAC TROMETHAMINE 30 MG/ML IJ SOLN: 30.0000 mg | Freq: Once | INTRAMUSCULAR | Status: AC | PRN

## 2021-10-11 MED ORDER — LACTATED RINGERS IV SOLN: 500.0000 mL | Freq: Once | INTRAVENOUS | Status: DC

## 2021-10-11 MED ORDER — LIDOCAINE HCL (PF) 1 % IJ SOLN
INTRAMUSCULAR | Status: DC | PRN
  Administered 2021-10-11 (×2): 4 mL via EPIDURAL

## 2021-10-11 MED ORDER — OXYTOCIN-SODIUM CHLORIDE 30-0.9 UT/500ML-% IV SOLN: 2.5000 [IU]/h | INTRAVENOUS | Status: DC

## 2021-10-11 MED ORDER — FENTANYL-BUPIVACAINE-NACL 0.5-0.125-0.9 MG/250ML-% EP SOLN
12.0000 mL/h | EPIDURAL | Status: DC | PRN
  Administered 2021-10-11: 12 mL/h via EPIDURAL
  Filled 2021-10-11: qty 250

## 2021-10-11 MED ORDER — MISOPROSTOL 25 MCG QUARTER TABLET
25.0000 ug | ORAL_TABLET | Freq: Once | ORAL | Status: AC
  Administered 2021-10-11: 25 ug via BUCCAL

## 2021-10-11 MED ORDER — LACTATED RINGERS IV SOLN: 500.0000 mL | INTRAVENOUS | Status: DC | PRN

## 2021-10-11 MED ORDER — PHENYLEPHRINE HCL (PRESSORS) 10 MG/ML IV SOLN
INTRAVENOUS | Status: DC | PRN
  Administered 2021-10-11: 80 ug via INTRAVENOUS
  Administered 2021-10-11: 40 ug via INTRAVENOUS

## 2021-10-11 MED ORDER — LACTATED RINGERS IV SOLN: INTRAVENOUS | Status: DC | PRN

## 2021-10-11 MED ORDER — MISOPROSTOL 25 MCG QUARTER TABLET
25.0000 ug | ORAL_TABLET | ORAL | Status: DC | PRN
  Administered 2021-10-11 (×2): 25 ug via VAGINAL
  Filled 2021-10-11 (×5): qty 1

## 2021-10-11 MED ORDER — HYDROMORPHONE HCL 1 MG/ML IJ SOLN
0.2500 mg | INTRAMUSCULAR | Status: DC | PRN
Start: 2021-10-11 — End: 2021-10-12

## 2021-10-11 MED ORDER — PHENYLEPHRINE 40 MCG/ML (10ML) SYRINGE FOR IV PUSH (FOR BLOOD PRESSURE SUPPORT)
80.0000 ug | PREFILLED_SYRINGE | INTRAVENOUS | Status: DC | PRN
  Filled 2021-10-11: qty 10

## 2021-10-11 MED ORDER — FENTANYL CITRATE (PF) 100 MCG/2ML IJ SOLN
INTRAMUSCULAR | Status: AC
  Filled 2021-10-11: qty 2

## 2021-10-11 MED ORDER — LIDOCAINE HCL (PF) 1 % IJ SOLN: 30.0000 mL | INTRAMUSCULAR | Status: DC | PRN

## 2021-10-11 MED ORDER — MEPERIDINE HCL 25 MG/ML IJ SOLN: 6.2500 mg | INTRAMUSCULAR | Status: DC | PRN

## 2021-10-11 MED ORDER — SODIUM CHLORIDE 0.9 % IV SOLN: INTRAVENOUS | Status: DC | PRN

## 2021-10-11 MED ORDER — SODIUM CHLORIDE 0.9 % IV SOLN
500.0000 mg | Freq: Once | INTRAVENOUS | Status: AC
  Administered 2021-10-11: 500 mg via INTRAVENOUS

## 2021-10-11 MED ORDER — EPHEDRINE 5 MG/ML INJ: 10.0000 mg | INTRAVENOUS | Status: DC | PRN

## 2021-10-11 MED ORDER — MISOPROSTOL 25 MCG QUARTER TABLET
25.0000 ug | ORAL_TABLET | ORAL | Status: DC | PRN
  Administered 2021-10-11: 25 ug via BUCCAL

## 2021-10-11 MED ORDER — PHENYLEPHRINE 40 MCG/ML (10ML) SYRINGE FOR IV PUSH (FOR BLOOD PRESSURE SUPPORT)
80.0000 ug | PREFILLED_SYRINGE | INTRAVENOUS | Status: DC | PRN
  Administered 2021-10-11: 80 ug via INTRAVENOUS

## 2021-10-11 MED ORDER — CEFAZOLIN SODIUM-DEXTROSE 2-4 GM/100ML-% IV SOLN
2.0000 g | Freq: Once | INTRAVENOUS | Status: AC
  Administered 2021-10-11: 2 g via INTRAVENOUS

## 2021-10-11 MED ORDER — OXYCODONE HCL 5 MG PO TABS: 5.0000 mg | ORAL_TABLET | Freq: Once | ORAL | Status: DC | PRN

## 2021-10-11 MED ORDER — SCOPOLAMINE 1 MG/3DAYS TD PT72
MEDICATED_PATCH | TRANSDERMAL | Status: DC | PRN
  Administered 2021-10-11: 1 via TRANSDERMAL

## 2021-10-11 MED ORDER — OXYCODONE-ACETAMINOPHEN 5-325 MG PO TABS: 2.0000 | ORAL_TABLET | ORAL | Status: DC | PRN

## 2021-10-11 MED ORDER — OXYTOCIN-SODIUM CHLORIDE 30-0.9 UT/500ML-% IV SOLN
INTRAVENOUS | Status: DC | PRN
  Administered 2021-10-11: 30 [IU] via INTRAVENOUS

## 2021-10-11 MED ORDER — DEXAMETHASONE SODIUM PHOSPHATE 10 MG/ML IJ SOLN
INTRAMUSCULAR | Status: DC | PRN
  Administered 2021-10-11: 10 mg via INTRAVENOUS

## 2021-10-11 MED ORDER — LACTATED RINGERS IV SOLN: INTRAVENOUS | Status: DC

## 2021-10-11 MED ORDER — SOD CITRATE-CITRIC ACID 500-334 MG/5ML PO SOLN
30.0000 mL | ORAL | Status: DC | PRN
  Administered 2021-10-11: 30 mL via ORAL
  Filled 2021-10-11 (×2): qty 30

## 2021-10-11 MED ORDER — OXYTOCIN BOLUS FROM INFUSION: 333.0000 mL | Freq: Once | INTRAVENOUS | Status: DC

## 2021-10-11 MED ORDER — DEXAMETHASONE SODIUM PHOSPHATE 10 MG/ML IJ SOLN
INTRAMUSCULAR | Status: AC
  Filled 2021-10-11: qty 1

## 2021-10-11 MED ORDER — DIPHENHYDRAMINE HCL 50 MG/ML IJ SOLN: 12.5000 mg | INTRAMUSCULAR | Status: DC | PRN

## 2021-10-11 MED ORDER — FENTANYL CITRATE (PF) 100 MCG/2ML IJ SOLN: 50.0000 ug | INTRAMUSCULAR | Status: DC | PRN

## 2021-10-11 MED ORDER — LEVOTHYROXINE SODIUM 75 MCG PO TABS
125.0000 ug | ORAL_TABLET | Freq: Every day | ORAL | Status: DC
  Administered 2021-10-13 – 2021-10-21 (×9): 125 ug via ORAL
  Filled 2021-10-11 (×14): qty 1

## 2021-10-11 MED ORDER — ONDANSETRON HCL 4 MG/2ML IJ SOLN
INTRAMUSCULAR | Status: DC | PRN
  Administered 2021-10-11: 4 mg via INTRAVENOUS

## 2021-10-11 MED ORDER — ONDANSETRON HCL 4 MG/2ML IJ SOLN
INTRAMUSCULAR | Status: AC
  Filled 2021-10-11: qty 2

## 2021-10-11 MED ORDER — FAMOTIDINE IN NACL 20-0.9 MG/50ML-% IV SOLN
20.0000 mg | Freq: Two times a day (BID) | INTRAVENOUS | Status: DC | PRN
  Administered 2021-10-11: 20 mg via INTRAVENOUS
  Filled 2021-10-11 (×2): qty 50

## 2021-10-11 MED ORDER — OXYCODONE-ACETAMINOPHEN 5-325 MG PO TABS: 1.0000 | ORAL_TABLET | ORAL | Status: DC | PRN

## 2021-10-11 MED ORDER — OXYCODONE HCL 5 MG/5ML PO SOLN: 5.0000 mg | Freq: Once | ORAL | Status: DC | PRN

## 2021-10-11 MED ORDER — MORPHINE SULFATE (PF) 0.5 MG/ML IJ SOLN
INTRAMUSCULAR | Status: AC
  Filled 2021-10-11: qty 10

## 2021-10-11 MED ORDER — PROMETHAZINE HCL 25 MG/ML IJ SOLN: 6.2500 mg | INTRAMUSCULAR | Status: DC | PRN

## 2021-10-11 MED ORDER — TERBUTALINE SULFATE 1 MG/ML IJ SOLN: 0.2500 mg | Freq: Once | INTRAMUSCULAR | Status: DC | PRN

## 2021-10-11 MED ORDER — SODIUM CHLORIDE 0.9 % IV SOLN
INTRAVENOUS | Status: AC
  Filled 2021-10-11: qty 500

## 2021-10-11 MED ORDER — ACETAMINOPHEN 325 MG PO TABS: 650.0000 mg | ORAL_TABLET | ORAL | Status: DC | PRN

## 2021-10-11 MED ORDER — TRANEXAMIC ACID-NACL 1000-0.7 MG/100ML-% IV SOLN
INTRAVENOUS | Status: DC | PRN
  Administered 2021-10-11: 1000 mg via INTRAVENOUS

## 2021-10-11 MED ORDER — ONDANSETRON HCL 4 MG/2ML IJ SOLN: 4.0000 mg | Freq: Four times a day (QID) | INTRAMUSCULAR | Status: DC | PRN

## 2021-10-11 SURGICAL SUPPLY — 38 items
BENZOIN TINCTURE PRP APPL 2/3 (GAUZE/BANDAGES/DRESSINGS) ×2 IMPLANT
CHLORAPREP W/TINT 26ML (MISCELLANEOUS) ×2 IMPLANT
CLAMP CORD UMBIL (MISCELLANEOUS) IMPLANT
CLOSURE STERI STRIP 1/2 X4 (GAUZE/BANDAGES/DRESSINGS) ×2 IMPLANT
CLOTH BEACON ORANGE TIMEOUT ST (SAFETY) ×2 IMPLANT
DRSG OPSITE POSTOP 4X10 (GAUZE/BANDAGES/DRESSINGS) ×2 IMPLANT
ELECT REM PT RETURN 9FT ADLT (ELECTROSURGICAL) ×2
ELECTRODE REM PT RTRN 9FT ADLT (ELECTROSURGICAL) ×1 IMPLANT
EXTRACTOR VACUUM KIWI (MISCELLANEOUS) IMPLANT
GLOVE BIOGEL PI IND STRL 6.5 (GLOVE) ×1 IMPLANT
GLOVE BIOGEL PI IND STRL 7.0 (GLOVE) ×1 IMPLANT
GLOVE BIOGEL PI INDICATOR 6.5 (GLOVE) ×1
GLOVE BIOGEL PI INDICATOR 7.0 (GLOVE) ×1
GLOVE ECLIPSE 6.0 STRL STRAW (GLOVE) ×2 IMPLANT
GOWN STRL REUS W/TWL LRG LVL3 (GOWN DISPOSABLE) ×4 IMPLANT
KIT ABG SYR 3ML LUER SLIP (SYRINGE) IMPLANT
NEEDLE HYPO 25X5/8 SAFETYGLIDE (NEEDLE) IMPLANT
NS IRRIG 1000ML POUR BTL (IV SOLUTION) ×2 IMPLANT
PACK C SECTION WH (CUSTOM PROCEDURE TRAY) ×2 IMPLANT
PAD OB MATERNITY 4.3X12.25 (PERSONAL CARE ITEMS) ×2 IMPLANT
PENCIL SMOKE EVAC W/HOLSTER (ELECTROSURGICAL) ×2 IMPLANT
RTRCTR C-SECT PINK 25CM LRG (MISCELLANEOUS) ×2 IMPLANT
STRIP CLOSURE SKIN 1/2X4 (GAUZE/BANDAGES/DRESSINGS) ×2 IMPLANT
SUT MNCRL 0 VIOLET CTX 36 (SUTURE) ×2 IMPLANT
SUT MON AB 2-0 SH 27 (SUTURE) ×4
SUT MON AB 2-0 SH27 (SUTURE) ×2 IMPLANT
SUT MONOCRYL 0 CTX 36 (SUTURE) ×2
SUT PLAIN 0 NONE (SUTURE) IMPLANT
SUT PLAIN 2 0 (SUTURE) ×2
SUT PLAIN ABS 2-0 CT1 27XMFL (SUTURE) ×1 IMPLANT
SUT VIC AB 0 CTX 36 (SUTURE) ×4
SUT VIC AB 0 CTX36XBRD ANBCTRL (SUTURE) ×2 IMPLANT
SUT VIC AB 2-0 CT1 27 (SUTURE) ×1
SUT VIC AB 2-0 CT1 TAPERPNT 27 (SUTURE) ×1 IMPLANT
SUT VICRYL 4-0 PS2 18IN ABS (SUTURE) ×2 IMPLANT
TOWEL OR 17X24 6PK STRL BLUE (TOWEL DISPOSABLE) ×2 IMPLANT
TRAY FOLEY W/BAG SLVR 14FR LF (SET/KITS/TRAYS/PACK) ×2 IMPLANT
WATER STERILE IRR 1000ML POUR (IV SOLUTION) ×4 IMPLANT

## 2021-10-11 NOTE — H&P (Signed)
50 y.o. G4P0030 @ [redacted]w[redacted]d presents for  induction of labor for advanced maternal age.  Overnight, she received 1 dose of cytotec.  She progressed to 5 cm, had spontaneous rupture of membranes then received an epidural.  Following epidural placement, she was rechecked by the nurse and was noted to be 8 cm.  She had an episode of mild hypotension following epidural placement that resulted in a few fetal heart rate decelerations.  The hypotension was corrected with phenylephrine and the fetal monitoring is now category 1  Pregnancy complicated by: IVF pregnancy with donor egg (age 63), low risk NIPT in first trimester Advanced maternal age.  Followed by [facility] this pregnancy.  On low dose aspirin  Hypothyroidism: s/p thyroidectomy due to thyroid cancer, followed by endocrinology.  On synthroid 125ug daily Pre-pregnancy BMI 35  Past Medical History:  Diagnosis Date   Anemia    Anxiety    situational anxiety only no treatment   PONV (postoperative nausea and vomiting)    Thyroid carcinoma ([facility])    Thyroid mass     Past Surgical History:  Procedure Laterality Date   DIAGNOSTIC LAPAROSCOPY     DILATION AND CURETTAGE OF UTERUS     DILATION AND EVACUATION  10/31/2011   Procedure: DILATATION AND EVACUATION (D&E);  Surgeon: Marcial Pacas;  Location: [facility];  Service: Gynecology;;   egg retrieval     THYROIDECTOMY  08/03/2016   THYROIDECTOMY Left 08/03/2016   Procedure: THYROIDECTOMY;  Surgeon: Izora Gala, MD;  Location: [facility];  Service: ENT;  Laterality: Left;  Left thyroid lobectomy with frozen section   THYROIDECTOMY  09/19/2016   THYROIDECTOMY N/A 09/19/2016   Procedure: COMPLETION THYROIDECTOMY;  Surgeon: Izora Gala, MD;  Location: [facility];  Service: ENT;  Laterality: N/A;    OB History  Gravida Para Term Preterm AB Living  4       3    SAB IAB Ectopic Multiple Live Births  2   1        # Outcome Date GA Lbr Len/2nd Weight Sex Delivery Anes PTL Lv  4 Current           3 Ectopic            2 SAB      SAB     1 SAB      SAB       Social History   Socioeconomic History   Marital status: Widowed    Spouse name: Not on file   Number of children: Not on file   Years of education: Not on file   Highest education level: Not on file  Occupational History   Occupation: Armed forces operational officer  Tobacco Use   Smoking status: Never   Smokeless tobacco: Never  Substance and Sexual Activity   Alcohol use: Not Currently    Comment: rare   Drug use: No   Sexual activity: Yes    Birth control/protection: None  Other Topics Concern   Not on file  Social History Narrative   Not on file   Social Determinants of Health   Financial Resource Strain: Not on file  Food Insecurity: Not on file  Transportation Needs: Not on file  Physical Activity: Not on file  Stress: Not on file  Social Connections: Not on file  Intimate Partner Violence: Not on file   No known allergies    Prenatal Transfer Tool  Maternal Diabetes: No Genetic Screening: Normal Maternal Ultrasounds/Referrals: Normal Fetal Ultrasounds or other  Referrals:  Referred to Materal Fetal Medicine  Maternal Substance Abuse:  No Significant Maternal Medications:  Meds include: Other: synthroid, aspirin Significant Maternal Lab Results: Group B Strep negative  ABO, Rh: --/--/O POS (11/09 0121) Antibody: NEG (11/09 0121) Rubella: Immune (04/18 0000) RPR: Nonreactive (04/18 0000)  HBsAg: Negative (04/18 0000)  HIV: Non-reactive (04/18 0000)  GBS:   Negative     Vitals:   10/11/21 0700 10/11/21 0706  BP: 129/81 133/77  Pulse: 84 93  Resp: 16 16  Temp:       General:  NAD Abdomen:  soft, gravid, EFW 7# Ex:  trace edema SVE:  8/70/-1 per RN FHTs:  120s, moderate variability, + accelerations, category 1 Toco:  not tracing well on side  10/28 [facility] scan: EFW 58% (3198g) 7lb1oz   A/P   50 y.o. G4P0030 [redacted]w[redacted]d presents for  induction of labor for advanced maternal age IOL: s/p one dose of cytotec with  spontaneous rupture of membranes, now in active labor.  Will add pitocin prn Hypothyroidism: continue synthroid  FSR/ vtx/ GBS negative  [place]

## 2021-10-11 NOTE — Progress Notes (Signed)
Patient complaints of some intermittent rectal discomfort.    BP 133/67   Pulse (!) 116   Temp 98.3 F (36.8 C) (Oral)   Resp 16   Ht 5\' 4"  (1.626 m)   Wt 93.2 kg   BMI 35.26 kg/m   Toco:  Irregular EFM: 140s, moderate variability, + accelerations.  Overall category 1, but occasional late appearing deceleration that resolves with position change SVE:  unchanged tissue surrounding the "dimple" now feels edematous and swollen  50 yo G5P0040 @ [redacted]w[redacted]d with IOL for AMA and IVF pregnancy Long discussion with patient.  She has made no cervical change in almost 24 hours despite 4 doses of misoprostol and spontaneous rupture of membranes.  We discussed additional dose of misoprostol vs pitocin.  She asks asks about proceeding with cesarean delivery at this time.  We further discussed that option, including the fact that her cervical exam is unusual and some degree of cervical change would have been expected after 24 hours of induction.  She does not desire to continue induction and would prefer to proceed to the operating room at this time for primary cesarean delivery.  We discussed the risks to cesarean section to include infection, bleeding, damage to surrounding structures (including but not limited to bowel, bladder, tubes, ovaries, nerves, vessels, baby), need for blood transfusion, venous thromboembolism, need for additional procedures.  Ancef and azithromycin on call to the OR.

## 2021-10-11 NOTE — Progress Notes (Signed)
Prior SVE by RN was called 8/70/-2.  On my exam, all that I could feel was smooth lower uterine segment with a dimple at the suspected cervical os.  Sutures were felt to be palpable through the lower uterine segment.  Given the drastic difference in SVE, a limited bedside ultrasound was performed.  This confirmed cephalic presentation.  A speculum exam was performed.  No head was visualized--only lower uterine segment /cervix with a small external os actively extruding normal cervical mucus.   I discussed with patient and family that unfortunately she was not 8 cm and was only FT/100/-2.  I do feel that ROM did occur as reported as there was small pooling in the vagina on speculum exam.  Patient desires to keep epidural at this time.  EFM is category 1 and she is contracting q5 minutes or less.  Will place another dose of cytotec at this time.

## 2021-10-11 NOTE — Progress Notes (Signed)
Blood pressures have been intermittently elevated over the last few hours.  CBC and CMP obtained and unremarkable.  She is asymptomatic.   Cervix remains unchanged.  She received her fourth dose of misoprostol at 1757.  Fetal heart rate tracing remains category 1.  She asks about how much time does she get following rupture of membranes.  Discussed we do not perform a cesarean section just due to prolonged ROM, but did discuss guidelines regarding failed induction of labor. Will re-evaluate at 2000.

## 2021-10-11 NOTE — Anesthesia Procedure Notes (Signed)
Epidural Patient location during procedure: OB Start time: 10/11/2021 6:45 AM End time: 10/11/2021 6:48 AM  Staffing Anesthesiologist: Brennan Bailey, MD Performed: anesthesiologist   Preanesthetic Checklist Completed: patient identified, IV checked, risks and benefits discussed, monitors and equipment checked, pre-op evaluation and timeout performed  Epidural Patient position: sitting Prep: DuraPrep and site prepped and draped Patient monitoring: continuous pulse ox, blood pressure and heart rate Approach: midline Location: L3-L4 Injection technique: LOR air  Needle:  Needle type: Tuohy  Needle gauge: 17 G Needle length: 9 cm Needle insertion depth: 6 cm Catheter type: closed end flexible Catheter size: 19 Gauge Catheter at skin depth: 11 cm Test dose: negative and Other (1% lidocaine)  Assessment Events: blood not aspirated, injection not painful, no injection resistance, no paresthesia and negative IV test  Additional Notes Patient identified. Risks, benefits, and alternatives discussed with patient including but not limited to bleeding, infection, nerve damage, paralysis, failed block, incomplete pain control, headache, blood pressure changes, nausea, vomiting, reactions to medication, itching, and postpartum back pain. Confirmed with bedside nurse the patient's most recent platelet count. Confirmed with patient that they are not currently taking any anticoagulation, have any bleeding history, or any family history of bleeding disorders. Patient expressed understanding and wished to proceed. All questions were answered. Sterile technique was used throughout the entire procedure. Please see nursing notes for vital signs.   Crisp LOR on first pass. Test dose was given through epidural catheter and negative prior to continuing to dose epidural or start infusion. Warning signs of high block given to the patient including shortness of breath, tingling/numbness in hands, complete  motor block, or any concerning symptoms with instructions to call for help. Patient was given instructions on fall risk and not to get out of bed. All questions and concerns addressed with instructions to call with any issues or inadequate analgesia.  Reason for block:procedure for pain

## 2021-10-11 NOTE — Anesthesia Preprocedure Evaluation (Signed)
Anesthesia Evaluation  Patient identified by MRN, date of birth, ID band Patient awake    Reviewed: Allergy & Precautions, Patient's Chart, lab work & pertinent test results  History of Anesthesia Complications (+) PONV and history of anesthetic complications  Airway Mallampati: II  TM Distance: >3 FB Neck ROM: Full    Dental no notable dental hx.    Pulmonary neg pulmonary ROS,    Pulmonary exam normal        Cardiovascular negative cardio ROS Normal cardiovascular exam     Neuro/Psych Anxiety negative neurological ROS     GI/Hepatic negative GI ROS, Neg liver ROS,   Endo/Other  negative endocrine ROS  Renal/GU negative Renal ROS  negative genitourinary   Musculoskeletal negative musculoskeletal ROS (+)   Abdominal   Peds  Hematology negative hematology ROS (+)   Anesthesia Other Findings Day of surgery medications reviewed with patient.  Reproductive/Obstetrics (+) Pregnancy                             Anesthesia Physical Anesthesia Plan  ASA: 2  Anesthesia Plan: Epidural   Post-op Pain Management:    Induction:   PONV Risk Score and Plan: Treatment may vary due to age or medical condition  Airway Management Planned: Natural Airway  Additional Equipment:   Intra-op Plan:   Post-operative Plan:   Informed Consent: I have reviewed the patients History and Physical, chart, labs and discussed the procedure including the risks, benefits and alternatives for the proposed anesthesia with the patient or authorized representative who has indicated his/her understanding and acceptance.       Plan Discussed with:   Anesthesia Plan Comments:         Anesthesia Quick Evaluation

## 2021-10-11 NOTE — Progress Notes (Signed)
Dr Carlis Abbott at bedside discussing risks/benefits of primary cesarean section, consenting patient.

## 2021-10-12 ENCOUNTER — Encounter (HOSPITAL_COMMUNITY): Payer: Self-pay | Admitting: Obstetrics and Gynecology

## 2021-10-12 LAB — CBC
HCT: 35.3 % — ABNORMAL LOW (ref 36.0–46.0)
Hemoglobin: 11.8 g/dL — ABNORMAL LOW (ref 12.0–15.0)
MCH: 30.2 pg (ref 26.0–34.0)
MCHC: 33.4 g/dL (ref 30.0–36.0)
MCV: 90.3 fL (ref 80.0–100.0)
Platelets: 265 10*3/uL (ref 150–400)
RBC: 3.91 MIL/uL (ref 3.87–5.11)
RDW: 13.3 % (ref 11.5–15.5)
WBC: 22 10*3/uL — ABNORMAL HIGH (ref 4.0–10.5)
nRBC: 0 % (ref 0.0–0.2)

## 2021-10-12 MED ORDER — NALOXONE HCL 4 MG/10ML IJ SOLN
1.0000 ug/kg/h | INTRAVENOUS | Status: DC | PRN
  Filled 2021-10-12: qty 5

## 2021-10-12 MED ORDER — OXYTOCIN-SODIUM CHLORIDE 30-0.9 UT/500ML-% IV SOLN
2.5000 [IU]/h | INTRAVENOUS | Status: AC
  Administered 2021-10-12: 2.5 [IU]/h via INTRAVENOUS
  Filled 2021-10-12: qty 500

## 2021-10-12 MED ORDER — KETOROLAC TROMETHAMINE 30 MG/ML IJ SOLN: 30.0000 mg | Freq: Four times a day (QID) | INTRAMUSCULAR | Status: DC | PRN

## 2021-10-12 MED ORDER — NALOXONE HCL 0.4 MG/ML IJ SOLN: 0.4000 mg | INTRAMUSCULAR | Status: DC | PRN

## 2021-10-12 MED ORDER — NALBUPHINE HCL 10 MG/ML IJ SOLN
5.0000 mg | Freq: Once | INTRAMUSCULAR | Status: AC | PRN
  Administered 2021-10-12: 5 mg via INTRAVENOUS

## 2021-10-12 MED ORDER — OXYCODONE HCL 5 MG PO TABS: 5.0000 mg | ORAL_TABLET | ORAL | Status: DC | PRN

## 2021-10-12 MED ORDER — KETOROLAC TROMETHAMINE 30 MG/ML IJ SOLN
30.0000 mg | Freq: Four times a day (QID) | INTRAMUSCULAR | Status: DC | PRN
  Administered 2021-10-12: 30 mg via INTRAVENOUS

## 2021-10-12 MED ORDER — NALBUPHINE HCL 10 MG/ML IJ SOLN: 5.0000 mg | Freq: Once | INTRAMUSCULAR | Status: AC | PRN

## 2021-10-12 MED ORDER — DIPHENHYDRAMINE HCL 50 MG/ML IJ SOLN: 12.5000 mg | INTRAMUSCULAR | Status: DC | PRN

## 2021-10-12 MED ORDER — NALBUPHINE HCL 10 MG/ML IJ SOLN
5.0000 mg | INTRAMUSCULAR | Status: DC | PRN
  Filled 2021-10-12: qty 1

## 2021-10-12 MED ORDER — SIMETHICONE 80 MG PO CHEW
80.0000 mg | CHEWABLE_TABLET | ORAL | Status: DC | PRN
  Administered 2021-10-16: 03:00:00 80 mg via ORAL
  Filled 2021-10-12: qty 1

## 2021-10-12 MED ORDER — ACETAMINOPHEN 500 MG PO TABS: 1000.0000 mg | ORAL_TABLET | Freq: Four times a day (QID) | ORAL | Status: DC

## 2021-10-12 MED ORDER — DIBUCAINE (PERIANAL) 1 % EX OINT: 1.0000 "application " | TOPICAL_OINTMENT | CUTANEOUS | Status: DC | PRN

## 2021-10-12 MED ORDER — KETOROLAC TROMETHAMINE 30 MG/ML IJ SOLN
INTRAMUSCULAR | Status: AC
  Filled 2021-10-12: qty 1

## 2021-10-12 MED ORDER — SENNOSIDES-DOCUSATE SODIUM 8.6-50 MG PO TABS
2.0000 | ORAL_TABLET | ORAL | Status: DC
  Administered 2021-10-13 – 2021-10-21 (×8): 2 via ORAL
  Filled 2021-10-12 (×8): qty 2

## 2021-10-12 MED ORDER — MENTHOL 3 MG MT LOZG: 1.0000 | LOZENGE | OROMUCOSAL | Status: DC | PRN

## 2021-10-12 MED ORDER — PRENATAL MULTIVITAMIN CH
1.0000 | ORAL_TABLET | Freq: Every day | ORAL | Status: DC
  Filled 2021-10-12 (×6): qty 1

## 2021-10-12 MED ORDER — IBUPROFEN 600 MG PO TABS
600.0000 mg | ORAL_TABLET | Freq: Four times a day (QID) | ORAL | Status: DC
  Administered 2021-10-13 – 2021-10-21 (×28): 600 mg via ORAL
  Filled 2021-10-12 (×32): qty 1

## 2021-10-12 MED ORDER — KETOROLAC TROMETHAMINE 30 MG/ML IJ SOLN
30.0000 mg | Freq: Four times a day (QID) | INTRAMUSCULAR | Status: AC
  Administered 2021-10-12 – 2021-10-13 (×3): 30 mg via INTRAVENOUS
  Filled 2021-10-12 (×4): qty 1

## 2021-10-12 MED ORDER — NALBUPHINE HCL 10 MG/ML IJ SOLN: 5.0000 mg | INTRAMUSCULAR | Status: DC | PRN

## 2021-10-12 MED ORDER — LACTATED RINGERS IV SOLN: INTRAVENOUS | Status: DC

## 2021-10-12 MED ORDER — ACETAMINOPHEN 500 MG PO TABS
1000.0000 mg | ORAL_TABLET | Freq: Four times a day (QID) | ORAL | Status: DC
  Administered 2021-10-12 – 2021-10-21 (×33): 1000 mg via ORAL
  Filled 2021-10-12 (×36): qty 2

## 2021-10-12 MED ORDER — DIPHENHYDRAMINE HCL 25 MG PO CAPS: 25.0000 mg | ORAL_CAPSULE | Freq: Four times a day (QID) | ORAL | Status: DC | PRN

## 2021-10-12 MED ORDER — ONDANSETRON HCL 4 MG/2ML IJ SOLN
4.0000 mg | Freq: Three times a day (TID) | INTRAMUSCULAR | Status: DC | PRN
  Administered 2021-10-12 (×2): 4 mg via INTRAVENOUS
  Filled 2021-10-12 (×2): qty 2

## 2021-10-12 MED ORDER — SIMETHICONE 80 MG PO CHEW
80.0000 mg | CHEWABLE_TABLET | Freq: Three times a day (TID) | ORAL | Status: DC
  Administered 2021-10-12 – 2021-10-21 (×23): 80 mg via ORAL
  Filled 2021-10-12 (×23): qty 1

## 2021-10-12 MED ORDER — WITCH HAZEL-GLYCERIN EX PADS: 1.0000 "application " | MEDICATED_PAD | CUTANEOUS | Status: DC | PRN

## 2021-10-12 MED ORDER — SODIUM CHLORIDE 0.9% FLUSH: 3.0000 mL | INTRAVENOUS | Status: DC | PRN

## 2021-10-12 MED ORDER — COCONUT OIL OIL
1.0000 "application " | TOPICAL_OIL | Status: DC | PRN
  Administered 2021-10-14: 1 via TOPICAL

## 2021-10-12 MED ORDER — LACTATED RINGERS IV BOLUS
1000.0000 mL | Freq: Once | INTRAVENOUS | Status: AC
Start: 2021-10-12 — End: 2021-10-12
  Administered 2021-10-12: 1000 mL via INTRAVENOUS

## 2021-10-12 MED ORDER — SCOPOLAMINE 1 MG/3DAYS TD PT72
1.0000 | MEDICATED_PATCH | Freq: Once | TRANSDERMAL | Status: DC
  Filled 2021-10-12: qty 1

## 2021-10-12 MED ORDER — MORPHINE SULFATE (PF) 0.5 MG/ML IJ SOLN
INTRAMUSCULAR | Status: DC | PRN
  Administered 2021-10-11: 3 mg via EPIDURAL

## 2021-10-12 MED ORDER — DIPHENHYDRAMINE HCL 25 MG PO CAPS: 25.0000 mg | ORAL_CAPSULE | ORAL | Status: DC | PRN

## 2021-10-12 MED ORDER — FAMOTIDINE 20 MG PO TABS
20.0000 mg | ORAL_TABLET | Freq: Two times a day (BID) | ORAL | Status: DC | PRN
  Administered 2021-10-16 – 2021-10-19 (×4): 20 mg via ORAL
  Filled 2021-10-12 (×4): qty 1

## 2021-10-12 NOTE — Lactation Note (Addendum)
This note was copied from a baby's chart. Lactation Consultation Note  Patient Name: Connie Randall Date: 10/12/2021 Reason for consult: Mother's request;Follow-up assessment;Difficult latch;1st time breastfeeding;Term (Mom and RN request for [id number] services, RN-Mary Chiquita Loth.) Age:49 hours Per mom, infant has not latched since birth, mom had emesis and  has been very tired, infant has been supplementing with donor breast milk. Mom would like to latch infant at the breast at this time with [facility] assistance, infant is cuing to breastfeed. Mom has large diameter nipples that are inverted and when compressed or touched they immediately inverts inward. Mom was given hand pump to pre-pump breast prior to latching infant, after multiple attempts infant would immediately come off breat and nipple would compressed inward, mom was fitted with 24 mm NS. Mom pre-pump breast with hand pump, used 24 mm NS and latched infant on her right breast using the football hold position, infant sustained latch, was supplemented with 5 mls of donor breast milk at the breast with curve tip syringe , infant breastfeed for 20 minutes.  Infant took total of 10 mls of donor breast milk with this feeding, 5 mls at breast and afterwards 5 mls with curve tip syringe on LC's gloved finger. Mom was given breast shells to wear in bra when not sleeping to help evert nipple shaft out more to help with latch. LC re-fitted mom with 27 mm breast flange. Mom shown how to use DEBP & how to disassemble, clean, & reassemble parts.  Mom's current plan: 1- Mom will pre-pump with hand pump and apply 24 mm NS prior to latching infant at the breast. 2- Mom will offer any her EBM first that is pumped and then continue to supplement infant with donor breast milk. 3- Mom will use DEBP every 3 hours for 15 minutes on initial setting.  4- Mom knows to call RN/LC for  further latch assistance if needed. Maternal Data    Feeding Mother's Current  Feeding Choice: Breast Milk and Donor Milk Nipple Type: Slow - flow  LATCH Score Latch: Grasps breast easily, tongue down, lips flanged, rhythmical sucking.  Audible Swallowing: A few with stimulation  Type of Nipple: Inverted  Comfort (Breast/Nipple): Soft / non-tender  Hold (Positioning): Assistance needed to correctly position infant at breast and maintain latch.  LATCH Score: 6   Lactation Tools Discussed/Used Tools: Shells;Pump Breast pump type: Double-Electric Breast Pump Pump Education: Setup, frequency, and cleaning;Milk Storage Reason for Pumping: Difficulty with latching at the breast, inverted nipples, 24 mm NS given. Pumping frequency: Mom will pump every 3 hours for 15 minutes on inital setting.  Interventions Interventions: Breast feeding basics reviewed;Assisted with latch;Skin to skin;Breast compression;Adjust position;Support pillows;Position options;DEBP;Education;Pre-pump if needed  Discharge Pump: DEBP  Consult Status Consult Status: Follow-up Date: 10/13/21 Follow-up type: In-patient    [name] 10/12/2021, 5:45 PM

## 2021-10-12 NOTE — Progress Notes (Signed)
POSTPARTUM POSTOP PROGRESS NOTE  POD #1  Subjective:  Late section, patient very tired this AM. Main complaint from her sister is ongoing nausea, zofran does provide some relief. Foley still in place, minimal PO intake. Asks about IV in place, reviewed would keep that in tunil good UOP and PO intake. Pain overall well-controlled. No flatus or BM yet. Minimal lochia per sister.  Objective: Blood pressure 136/85, pulse 88, temperature 97.7 F (36.5 C), temperature source Oral, resp. rate 18, height 5\' 4"  (1.626 m), weight 93.2 kg, SpO2 95 %, unknown if currently breastfeeding.  Physical Exam:  General: alert, cooperative and no distress Lochia:normal flow Chest: CTAB Heart: RRR no m/r/g Abdomen: +BS, soft, nontender Uterine Fundus: firm, 2cm below umbilicus. Honeycomb dressing intact, neg drainage Extremities: neg edema, neg calf TTP BL, neg Homans BL  Recent Labs    10/11/21 1541 10/12/21 0530  HGB 12.5 11.8*  HCT 37.6 35.3*    Assessment/Plan:  ASSESSMENT: TARENA GOCKLEY is a 50 y.o. [id number] s/p PLTCS @ [redacted]w[redacted]d for failed induction of labor. PNC c/b AMA, IVF with donor egg (neg NIPT), hypothyroidism on 125 synthroid, BMI 35  Lactation consult and Circumcision prior to discharge PRN antiemetics for nausea Intermittent elevated BP - PreE labs WNL, asymptomatic, will watch today and treat as necc Hold circ for today    LOS: 1 day

## 2021-10-12 NOTE — Progress Notes (Signed)
Patient BP after 5 hourly checks is now WDL, pt nausea has improved after medication.   Eartha Inch RN

## 2021-10-12 NOTE — Progress Notes (Signed)
Informed of low UOP during day shift, Foley still in place. Patient did have nausea in AM that is now improved, is tolerating PO liquids. Will do IV bolus, if UOP appropriate after, can DC foley

## 2021-10-12 NOTE — Anesthesia Postprocedure Evaluation (Signed)
Anesthesia Post Note  Patient: Connie Randall  Procedure(s) Performed: CESAREAN SECTION     Patient location during evaluation: PACU Anesthesia Type: Epidural Level of consciousness: awake and alert and oriented Pain management: pain level controlled Vital Signs Assessment: post-procedure vital signs reviewed and stable Respiratory status: spontaneous breathing, nonlabored ventilation and respiratory function stable Cardiovascular status: blood pressure returned to baseline and stable Postop Assessment: no headache, no backache, epidural receding and no apparent nausea or vomiting Anesthetic complications: no   No notable events documented.  Last Vitals:  Vitals:   10/12/21 0030 10/12/21 0045  BP: 132/80 (!) 141/84  Pulse: 91 85  Resp: (!) 22 (!) 27  Temp:  36.7 C  SpO2: 98% 96%    Last Pain:  Vitals:   10/12/21 0045  TempSrc:   PainSc: 0-No pain   Pain Goal:    LLE Motor Response: Purposeful movement (10/12/21 0045)   RLE Motor Response: Purposeful movement (10/12/21 0045)       Epidural/Spinal Function Cutaneous sensation: Able to Wiggle Toes (10/12/21 0045), Patient able to flex knees: Yes (10/12/21 0045), Patient able to lift hips off bed: No (10/12/21 0045), Back pain beyond tenderness at insertion site: No (10/12/21 0045), Progressively worsening motor and/or sensory loss: No (10/12/21 0045), Bowel and/or bladder incontinence post epidural: No (10/12/21 0045)  Pervis Hocking

## 2021-10-12 NOTE — Op Note (Addendum)
Cesarean Section Procedure Note  Pre-operative Diagnosis: 1. Intrauterine pregnancy at [redacted]w[redacted]d  2. Advanced maternal age  50. Failed induction of labor  Post-operative Diagnosis: same as above  Surgeon: Jerelyn Charles, MD  Procedure: Primary low transverse cesarean section   Anesthesia: Epidural anesthesia  Estimated Blood Loss: 303 mL         Drains: Foley catheter         Specimens: placenta to L&D                 Disposition: PACU - hemodynamically stable.  Findings:  Normal uterus.  Normal right tube and ovary.  Left tube surgically absent.  Left ovary not visualized intraoperatively.  Viable female infant, 3335g (7lb 5.6oz) Apgars 9, 9.    Procedure Details   After epidural  anesthesia was found to adequate, the patient was placed in the dorsal supine position with a leftward tilt, prepped and draped in the usual sterile manner. A Pfannenstiel incision was made and carried down through the subcutaneous tissue to the fascia.  The fascia was incised in the midline and the fascial incision was extended laterally with Mayo scissors. The superior aspect of the fascial incision was grasped with two Kocher clamps, tented up and the rectus muscles dissected off sharply. The rectus was then dissected off with blunt dissection and Mayo scissors inferiorly. The rectus muscles were separated in the midline. The abdominal peritoneum was identified, tented up, entered bluntly, and the incision was extended superiorly and inferiorly with good visualization of the bladder. The Alexis retractor was deployed. The vesicouterine peritoneum was identified, tented up, entered sharply, and the bladder flap was created digitally. A scalpel was then used to make a low transverse incision on the uterus which was extended in the cephalad-caudad direction with blunt dissection. The fluid was clear. The fetal vertex was identified, elevated out of the pelvis and brought to the hysterotomy.  The head was delivered easily  followed by the shoulders and body.  After a 60 second delay per protocol, the cord was clamped and cut and the infant was passed to the waiting neonatologist.  The placenta was then delivered spontaneously, intact and appear normal, the uterus was cleared of all clot and debris   The hysterotomy was repaired with #0 Monocryl in running locked fashion.  A second imbricating layer of #0 Monocryl was placed.  Excellent hemostasis was noted.  Bovie cautery was used to control minor bleeding along the edge of the bladder flap. The Alexis retractor was removed from the abdomen. The peritoneum was examined and all vessels noted to be hemostatic. The abdominal cavity was cleared of all clot and debris.  The peritoneum was closed with 2-0 vicryl in a running fashion.  The rectus muscles were then closed with 2-0 Vicryl. The fascia and rectus muscles were inspected and were hemostatic. The fascia was closed with 0 Vicryl in a running fashion. The subcutaneous layer was irrigated and all bleeders cauterized. The subcutaneous layer was closed with interrupted plain gut. The skin was closed with 3-0 monocryl in a subcuticular fashion. The incision was dressed with benzoine, steri strips and honeycomb dressing. All sponge lap and needle counts were correct x3. Patient tolerated the procedure well and recovered in stable condition following the procedure.

## 2021-10-12 NOTE — Progress Notes (Signed)
Pt is having elevated BP from her hour out, 141/86, 15 minutes later 149/90, called Dr.clark, was told to continue to monitor and contact if BP gets over 160/110.  Eartha Inch RN

## 2021-10-12 NOTE — Transfer of Care (Signed)
Immediate Anesthesia Transfer of Care Note  Patient: Connie Randall  Procedure(s) Performed: CESAREAN SECTION  Patient Location: PACU  Anesthesia Type:Epidural  Level of Consciousness: Oriented, drowsy   Airway & Oxygen Therapy: Patient Spontanous Breathing  Post-op Assessment: Report given to RN and Post -op Vital signs reviewed and stable  Post vital signs: Reviewed and stable  Last Vitals:  Vitals Value Taken Time  BP 120/70 10/12/21 0008  Temp    Pulse 99 10/12/21 0012  Resp 29 10/12/21 0012  SpO2 100 % 10/12/21 0012  Vitals shown include unvalidated device data.  Last Pain:  Vitals:   10/11/21 2200  TempSrc:   PainSc: 0-No pain         Complications: No notable events documented.

## 2021-10-12 NOTE — Lactation Note (Signed)
This note was copied from a baby's chart. Lactation Consultation Note  Patient Name: Connie Randall [id number] Date: 10/12/2021 Reason for consult: Initial assessment;Term;Primapara;1st time breastfeeding Age:49 hours   P1 mother whose infant is now 75 hours old.  This is a term baby at 39+1 weeks.  RN requested latch assistance.  Baby "Connie Randall" was beginning to show feeding cues.  Taught hand expression; unable to express drops at this time.  Assisted to latch after repeated attempts.  Mother has large, short shafted nipples that invert after baby latches and begins to suck.  "Connie Randall" took a few intermittent sucks before releasing.  Swaddled and placed in bassinet.  Mother will benefit by using breast shells and a manual pump.  She is interested, however, not at this time.  She is very sleepy and could not keep her eyes open during my visit.  Suggested she rest and when "Connie Randall" is ready to feed again to call for assistance.  Mother verbalized understanding.  RN updated.  Support person present.   Maternal Data Has patient been taught Hand Expression?: Yes Does the patient have breastfeeding experience prior to this delivery?: No  Feeding Mother's Current Feeding Choice: Breast Milk  LATCH Score Latch: Repeated attempts needed to sustain latch, nipple held in mouth throughout feeding, stimulation needed to elicit sucking reflex.  Audible Swallowing: None  Type of Nipple: Everted at rest and after stimulation (short shafted and invert after latching)  Comfort (Breast/Nipple): Soft / non-tender  Hold (Positioning): Assistance needed to correctly position infant at breast and maintain latch.  LATCH Score: 6   Lactation Tools Discussed/Used    Interventions Interventions: Breast feeding basics reviewed;Assisted with latch;Skin to skin;Breast massage;Hand express;Pre-pump if needed;Breast compression;Position options;Support pillows;Adjust position;Education  Discharge    Consult  Status Consult Status: Follow-up Date: 10/13/21 Follow-up type: In-patient    Connie Randall R Romulo Okray 10/12/2021, 3:53 AM

## 2021-10-12 NOTE — Social Work (Signed)
MOB was referred for history of anxiety.  * Referral screened out by Clinical Social Worker because none of the following criteria appear to apply: ~ History of anxiety during this pregnancy, or of post-partum depression following prior delivery. ~ Diagnosis of anxiety and/or depression within last 3 years OR * MOB's symptoms currently being treated with medication and/or therapy. Per chart review, MOB currently sees a grief counselor and was given the contact information for a CSW while at [facility]. MOB is well supported by mother and sister.   Please contact the Clinical Social Worker if needs arise, by [facility] request, or if MOB scores greater than 9/yes to question 10 on [place] Postpartum Depression Screen.   Kathrin Greathouse, MSW, LCSW [facility] and [place] Worker  [phone] 10/12/2021  8:36 AM

## 2021-10-13 MED ORDER — LIDOCAINE 1% INJECTION FOR CIRCUMCISION: 0.8000 mL | INJECTION | Freq: Once | INTRAVENOUS | Status: DC

## 2021-10-13 MED ORDER — ACETAMINOPHEN FOR CIRCUMCISION 160 MG/5 ML: 40.0000 mg | ORAL | Status: DC | PRN

## 2021-10-13 MED ORDER — EPINEPHRINE TOPICAL FOR CIRCUMCISION 0.1 MG/ML: 1.0000 [drp] | TOPICAL | Status: DC | PRN

## 2021-10-13 MED ORDER — SUCROSE 24% NICU/PEDS ORAL SOLUTION: 0.5000 mL | OROMUCOSAL | Status: DC | PRN

## 2021-10-13 MED ORDER — ACETAMINOPHEN FOR CIRCUMCISION 160 MG/5 ML: 40.0000 mg | Freq: Once | ORAL | Status: DC

## 2021-10-13 MED ORDER — WHITE PETROLATUM EX OINT: 1.0000 "application " | TOPICAL_OINTMENT | CUTANEOUS | Status: DC | PRN

## 2021-10-13 NOTE — Progress Notes (Signed)
RN reassessed patient's shortness of breath and she states that it is improving.  RN will continue to monitor.  Aftyn Nott, [place]

## 2021-10-13 NOTE — Progress Notes (Signed)
Subjective: Postpartum Day 2: Cesarean Delivery Patient reports pain controlled, no nausea or vomiting. Ambulating without difficulty. Tolerating po Having difficulty with breastfeeding, latching, and pumping  Objective: Vital signs in last 24 hours: Temp:  [98.1 F (36.7 C)-98.6 F (37 C)] 98.1 F (36.7 C) (11/11 1613) Pulse Rate:  [65-76] 73 (11/11 1443) Resp:  [15-20] 20 (11/11 1613) BP: (132-141)/(74-85) 141/75 (11/11 1613) SpO2:  [94 %-99 %] 98 % (11/11 1613)  Vitals:   10/12/21 2338 10/13/21 0603 10/13/21 1443 10/13/21 1613  BP: 132/74 133/75 140/85 (!) 141/75  Pulse: 65 76 73   Resp: 16 15 16 20   Temp: 98.6 F (37 C) 98.6 F (37 C) 98.4 F (36.9 C) 98.1 F (36.7 C)  TempSrc: Oral Oral Oral Oral  SpO2: 94% 96% 99% 98%  Weight:      Height:         Physical Exam:  General: alert, cooperative, and appears stated age 2: appropriate Uterine Fundus: firm Incision: healing well DVT Evaluation: No evidence of DVT seen on physical exam.  Recent Labs    10/11/21 1541 10/12/21 0530  HGB 12.5 11.8*  HCT 37.6 35.3*    Assessment/Plan: Status post Cesarean section. Doing well postoperatively.  Continue current care. Desires neonatal circumcision, R/B/A of procedure discussed at length. Pt understands that neonatal circumcision is not considered medically necessary and is elective. The risks include, but are not limited to bleeding, infection, damage to the penis, development of scar tissue, and having to have it redone at a later date. Pt understands theses risks and wishes to proceed BPs overall normotensive. Pt has had an occasional SBP of 140. Antihypertensive medication not indicated at this time. Anticipate D/C home tomorrow   Vanessa Kick 10/13/2021, 6:06 PM

## 2021-10-13 NOTE — Progress Notes (Signed)
Upon entering patient room, she complained of shortness of breath.  RN assessed her breath sounds, her lungs sounded clear.  Vital signs are: BP 141/75, HR 73, RR 20, O2 98%. Patient states she does not feel anxious and she is not experiencing any chest pain.  Patient states she has been walking around and has used her incentive spirometer.  Dr. Harrington Challenger was notified and requested patient use her incentive spirometer more and then reassess.  RN encouraged patient to use the incentive spirometer, ambulate more and sit up more in bed.  RN will continue to assess.  Anacaren Kohan, [place]

## 2021-10-14 LAB — COMPREHENSIVE METABOLIC PANEL
ALT: 20 U/L (ref 0–44)
AST: 27 U/L (ref 15–41)
Albumin: 2 g/dL — ABNORMAL LOW (ref 3.5–5.0)
Alkaline Phosphatase: 109 U/L (ref 38–126)
Anion gap: 6 (ref 5–15)
BUN: 8 mg/dL (ref 6–20)
CO2: 23 mmol/L (ref 22–32)
Calcium: 7.8 mg/dL — ABNORMAL LOW (ref 8.9–10.3)
Chloride: 107 mmol/L (ref 98–111)
Creatinine, Ser: 0.81 mg/dL (ref 0.44–1.00)
GFR, Estimated: >60 mL/min (ref >60)
Glucose, Bld: 73 mg/dL (ref 70–99)
Potassium: 3.8 mmol/L (ref 3.5–5.1)
Sodium: 136 mmol/L (ref 135–145)
Total Bilirubin: 0.4 mg/dL (ref 0.3–1.2)
Total Protein: 5.3 g/dL — ABNORMAL LOW (ref 6.5–8.1)

## 2021-10-14 LAB — PROTEIN / CREATININE RATIO, URINE
Creatinine, Urine: 56.8 mg/dL
Total Protein, Urine: <6 mg/dL

## 2021-10-14 MED ORDER — NIFEDIPINE ER OSMOTIC RELEASE 30 MG PO TB24
30.0000 mg | ORAL_TABLET | Freq: Every day | ORAL | Status: DC
  Administered 2021-10-14 – 2021-10-18 (×6): 30 mg via ORAL
  Filled 2021-10-14 (×6): qty 1

## 2021-10-14 NOTE — Progress Notes (Signed)
Subjective: Postpartum Day 3: Cesarean Delivery Patient reports tolerating PO, + flatus, + BM, and no problems voiding.  Lochia mild. Denies HA, blurry vision, CP. She has had mild SOB since yesterday - better with incentive spirometry. She is bonding well with baby.   Objective: Vital signs in last 24 hours: Temp:  [98.1 F (36.7 C)-98.5 F (36.9 C)] 98.3 F (36.8 C) (11/12 0538) Pulse Rate:  [68-73] 68 (11/12 0538) Resp:  [16-20] 18 (11/12 0538) BP: (140-154)/(75-97) 154/97 (11/12 1311) SpO2:  [98 %-99 %] 98 % (11/11 1613)  Physical Exam:  General: alert, cooperative, and no distress Lochia: appropriate Uterine Fundus: firm Incision: no significant drainage DVT Evaluation: No evidence of DVT seen on physical exam.  Recent Labs    10/11/21 1541 10/12/21 0530  HGB 12.5 11.8*  HCT 37.6 35.3*    Assessment/Plan: Status post Cesarean section. Postoperative course complicated by postpartum hypertension - will treat with procardia now; obtain labs, check BP q 4hrs   Will treat with MgSO4 if indicated  .  Isaiah Serge 10/14/2021, 1:37 PM

## 2021-10-14 NOTE — Progress Notes (Signed)
Dr. Terri Piedra informed of recent BPs of 140/77 and 140/83. MD pleased that BP has decreased and no further orders. Dr. Terri Piedra wants Procardia given at 6 am tomorrow to determine effectiveness of med for patient's discharge.

## 2021-10-14 NOTE — Lactation Note (Signed)
This note was copied from a baby's chart. Lactation Consultation Note  Patient Name: Connie Randall [id number] Date: 10/14/2021   Age:49 hours Secretary was exiting mom's room and informed [place] mom doesn't want [place].  Maternal Data    Feeding    LATCH Score                    Lactation Tools Discussed/Used    Interventions    Discharge    Consult Status      [name] 10/14/2021, 2:34 AM

## 2021-10-14 NOTE — Lactation Note (Signed)
This note was copied from a baby's chart. Lactation Consultation Note  Patient Name: Connie Randall Date: 10/14/2021 Reason for consult: Follow-up assessment;Difficult latch;Primapara;1st time breastfeeding;Term Age:49 hours  LC in to room for follow up. Maternal aunt is bottlefeeding formula. LC urged to pace when bottlefeeding and reviewed feeding volume according to age (30-60 mL per feeding).  Mother states infant has difficulty latching because her nipples are inverted and to big. Mother has a 24-mm nipple shield to assist with latch and it is cumbersome to instill formula to NS. Demonstrated placement of NS, "Connie Randall" able to latch and transfer ~12 mL of formula with NS.  Reinforced pumping for proper stimulation. LC noted areolar edema. Mother is to start with 36-mm flanges to soften tissue and then use 30-mm flanges. Collected ~1 mL during demonstration.  Discussed normal infant behavior, clusterfeeding, normal output. Talked about milk coming into volume.   Feeding plan:  1-Feeding on demand or 8-12 times in 24h period using 24-mm nipple shield. 2-Pump using 36 & 30-mm flanges after feedings when using nipple shield. 3-Instill colostrum/formula inside nipple shield to stimulate infant 4-Encouraged maternal rest, hydration and food intake.  5-Supplement as needed with formula following provided volume guidelines  Contact LC as needed for feeds/support/concerns/questions. All questions answered at this time.    Maternal Data Has patient been taught Hand Expression?: Yes Does the patient have breastfeeding experience prior to this delivery?: No  Feeding Mother's Current Feeding Choice: Breast Milk and Formula  LATCH Score Latch: Grasps breast easily, tongue down, lips flanged, rhythmical sucking.  Audible Swallowing: Spontaneous and intermittent  Type of Nipple: Inverted  Comfort (Breast/Nipple): Filling, red/small blisters or bruises, mild/mod discomfort  Hold  (Positioning): Assistance needed to correctly position infant at breast and maintain latch.  LATCH Score: 6   Lactation Tools Discussed/Used Tools: Pump;Flanges;Coconut oil;Nipple Shields Nipple shield size: 24 Flange Size: 30;36 Breast pump type: Double-Electric Breast Pump;Manual Pump Education: Setup, frequency, and cleaning;Milk Storage Reason for Pumping: nipple eversion / NS in use / stimulation and supplementation Pumping frequency: Q3 Pumped volume: 1 mL  Interventions Interventions: Breast feeding basics reviewed;Assisted with latch;Skin to skin;Breast massage;Hand express;Pre-pump if needed;Breast compression;Adjust position;DEBP;Hand pump;Coconut oil;Expressed milk;Position options;Support pillows;Education;Pace feeding  Discharge Discharge Education: Engorgement and breast care Pump: DEBP;Manual;Personal  Consult Status Consult Status: Follow-up Date: 10/15/21 Follow-up type: In-patient    Connie Randall 10/14/2021, 3:04 PM

## 2021-10-15 ENCOUNTER — Inpatient Hospital Stay (HOSPITAL_COMMUNITY): Payer: BC Managed Care – PPO

## 2021-10-15 LAB — CBC
HCT: 36.9 % (ref 36.0–46.0)
Hemoglobin: 12.5 g/dL (ref 12.0–15.0)
MCH: 29.8 pg (ref 26.0–34.0)
MCHC: 33.9 g/dL (ref 30.0–36.0)
MCV: 88.1 fL (ref 80.0–100.0)
Platelets: 386 10*3/uL (ref 150–400)
RBC: 4.19 MIL/uL (ref 3.87–5.11)
RDW: 13.2 % (ref 11.5–15.5)
WBC: 16.4 10*3/uL — ABNORMAL HIGH (ref 4.0–10.5)
nRBC: 0 % (ref 0.0–0.2)

## 2021-10-15 LAB — HEPATIC FUNCTION PANEL
ALT: 27 U/L (ref 0–44)
AST: 27 U/L (ref 15–41)
Albumin: 2.5 g/dL — ABNORMAL LOW (ref 3.5–5.0)
Alkaline Phosphatase: 139 U/L — ABNORMAL HIGH (ref 38–126)
Bilirubin, Direct: <0.1 mg/dL (ref 0.0–0.2)
Total Bilirubin: 0.7 mg/dL (ref 0.3–1.2)
Total Protein: 6.9 g/dL (ref 6.5–8.1)

## 2021-10-15 MED ORDER — NIFEDIPINE ER OSMOTIC RELEASE 30 MG PO TB24
30.0000 mg | ORAL_TABLET | Freq: Every day | ORAL | Status: AC
  Filled 2021-10-15: qty 1

## 2021-10-15 MED ORDER — NIFEDIPINE ER OSMOTIC RELEASE 30 MG PO TB24
30.0000 mg | ORAL_TABLET | Freq: Once | ORAL | Status: AC
  Administered 2021-10-15: 30 mg via ORAL

## 2021-10-15 NOTE — Progress Notes (Signed)
Pt complains of pain in abdomen, abdomen is mildly distended which MD was aware of this morning on rounds.  Pt has been given apple and prune juice twice today.  Pt also has walked in the halls 2-3 times up to present time of this note.  Pt has received simethicone as scheduled per orders.  Pt's blood pressure has remained under the parameters that were given by the doctor this am. Pt has no complaints of headache or blurry vision. Will continue to monitor.

## 2021-10-15 NOTE — Progress Notes (Addendum)
Subjective: Postpartum Day 4: Cesarean Delivery Patient reports tolerating PO, frequent voids - no dysuria, some anxiety due to sleepless night ( baby fussy), abdominal pain and discomfort. She denies HA or blurry vision, CP or SOB. Worried about being home alone with baby if continues with cluster feeding- considering hiring a postnatal doula.    Objective: Vital signs in last 24 hours: Temp:  [98 F (36.7 C)-99 F (37.2 C)] 98.2 F (36.8 C) (11/13 0823) Pulse Rate:  [71-77] 75 (11/13 0823) Resp:  [18-20] 20 (11/13 0823) BP: (140-154)/(77-97) 152/88 (11/13 0823) SpO2:  [98 %-99 %] 99 % (11/13 0554)  Physical Exam:  General: cooperative, fatigued, and mild distress Lochia: appropriate Uterine Fundus: firm, tympanic - mild distension with gas/air Incision: n/a DVT Evaluation: No evidence of DVT seen on physical exam.  No results for input(s): HGB, HCT in the last 72 hours.  Assessment/Plan: Status post Cesarean section. Postoperative course complicated by elevated BP   -I was not notified of elevated BPs overnight despite parameters given of >150SBP     and >95 DBP thus I informed day nurse and placed order. -Repeat labs now -Procardia 30xl given at 6am but will add additional dose now -Pending results and continued q 4 hr BP checks, pt aware that consideration will       be made as to whether to start on MgSO4. Risks and expectations reviewed -She was given simethicone while I was at bedside for gas related abdominal pain       and encouraged to have warm drinks and walk in the hallway today - Spoke to charge nurse about possibly having baby in nursery for a short while tonight to allow pt to sleep well  Isaiah Serge 10/15/2021, 10:37 AM

## 2021-10-15 NOTE — Lactation Note (Signed)
This note was copied from a baby's chart. Lactation Consultation Note  Patient Name: Connie Randall [id number] Date: 10/15/2021 Reason for consult: Mother's request Age:49 hours Mom requested [place] services confirmed by Secretary. LC changed void diaper and stool ( brown) while in room. LC entered the room, mom concern that she had engorgement due to nipple pain at the base of her nipple, mom  does have edema but no engorgement,she has a  nipple abrasion on her left nipple  from pumping earlier.   Mom was given 36 mm  and 30 mm  breast flange and when attempting to use DEBP mom complained of  nipple pain with pumping, mom re-fitted with 28 mm breast flange , per mom, not painful Mom attempted to latch infant on her right breast using the football hold position, infant latched briefly less than 2 minutes without NS, them fell asleep, mom informed LC she recently had given infant 40 mls of formula. LC encouraged mom to call RN/LC for latch assistance with next feeding. Mom was given comfort gels due to nipple abrasion from pumping, mom understands  to wear in bra and not to use it with coconut oil.   Maternal Data    Feeding Mother's Current Feeding Choice: Breast Milk and Formula  LATCH Score                    Lactation Tools Discussed/Used    Interventions    Discharge    Consult Status      Connie Randall 10/15/2021, 12:25 AM

## 2021-10-15 NOTE — Progress Notes (Signed)
Patient ID: Connie Randall, female   DOB: 04-28-1971, 50 y.o.   MRN: [id number] Chart check   BP 146/85 LFTs in normal range at 27/27  ( from 27/20) 16.4>12.5<386  Continue to monitor BP  No indication for MgSO4 at this time

## 2021-10-15 NOTE — Progress Notes (Signed)
Notified Dr. Terri Piedra of pt's status. BP 154/83 right arm at 2002 and 150/93 left arm at 2003. Pt vomited large amount at 1910 during shift change report. Vomited without preceding nausea, measured emesis 400cc but large amount on floor as well. Pt reports has not eaten today because she has felt too full. Endorses walked in hallway today, drank some water and prune/apple juice, passed some flatus but not recently. Abdomen soft but distended with no bowel sounds auscultated by RN. Pt endorses RUQ pain and generalized abdominal soreness. Orders received (procardia 1x dose, stat CT abdomen with and without contrast, NPO after procardia dose. Pt and mother updated on plan of care. IV team consult placed for new IV access.

## 2021-10-15 NOTE — Progress Notes (Signed)
Patient ID: Connie Randall, female   DOB: 1971-08-22, 50 y.o.   MRN: [id number] Late entry  I was called and notified that pt had a spontaneous bout of copious vomitus ( approx 350cc per nursing staff). She denied any precipitating nausea and felt well afterwards however pt had continued to complain of abdominal distension/bloating all day and had limited bowel movements despite ambulating, using simethicone and warm drinks.   Her BP shortly afterwards was noted at 150-154/83-93  I recommended pt be given one pm dose of 30xl procardia ( she had received two doses of 30xl in am = 60xl)  Repeat BP for monitoring control   I also recommended pt be kept NPO after medication above and an abdominal CT w/wout contrast be obtained.

## 2021-10-16 ENCOUNTER — Inpatient Hospital Stay (HOSPITAL_COMMUNITY): Payer: BC Managed Care – PPO

## 2021-10-16 LAB — COMPREHENSIVE METABOLIC PANEL
ALT: 29 U/L (ref 0–44)
AST: 25 U/L (ref 15–41)
Albumin: 2.6 g/dL — ABNORMAL LOW (ref 3.5–5.0)
Alkaline Phosphatase: 159 U/L — ABNORMAL HIGH (ref 38–126)
Anion gap: 12 (ref 5–15)
BUN: 5 mg/dL — ABNORMAL LOW (ref 6–20)
CO2: 21 mmol/L — ABNORMAL LOW (ref 22–32)
Calcium: 8.6 mg/dL — ABNORMAL LOW (ref 8.9–10.3)
Chloride: 99 mmol/L (ref 98–111)
Creatinine, Ser: 0.83 mg/dL (ref 0.44–1.00)
GFR, Estimated: >60 mL/min (ref >60)
Glucose, Bld: 122 mg/dL — ABNORMAL HIGH (ref 70–99)
Potassium: 3.6 mmol/L (ref 3.5–5.1)
Sodium: 132 mmol/L — ABNORMAL LOW (ref 135–145)
Total Bilirubin: 1 mg/dL (ref 0.3–1.2)
Total Protein: 7.5 g/dL (ref 6.5–8.1)

## 2021-10-16 LAB — CBC WITH DIFFERENTIAL/PLATELET
Abs Immature Granulocytes: 0 10*3/uL (ref 0.00–0.07)
Basophils Absolute: 0 10*3/uL (ref 0.0–0.1)
Basophils Relative: 0 %
Eosinophils Absolute: 0 10*3/uL (ref 0.0–0.5)
Eosinophils Relative: 0 %
HCT: 41.1 % (ref 36.0–46.0)
Hemoglobin: 14.2 g/dL (ref 12.0–15.0)
Lymphocytes Relative: 10 %
Lymphs Abs: 3.3 10*3/uL (ref 0.7–4.0)
MCH: 30 pg (ref 26.0–34.0)
MCHC: 34.5 g/dL (ref 30.0–36.0)
MCV: 86.7 fL (ref 80.0–100.0)
Monocytes Absolute: 0.3 10*3/uL (ref 0.1–1.0)
Monocytes Relative: 1 %
Neutro Abs: 29.5 10*3/uL — ABNORMAL HIGH (ref 1.7–7.7)
Neutrophils Relative %: 89 %
Platelets: 476 10*3/uL — ABNORMAL HIGH (ref 150–400)
RBC: 4.74 MIL/uL (ref 3.87–5.11)
RDW: 13.4 % (ref 11.5–15.5)
WBC: 33.1 10*3/uL — ABNORMAL HIGH (ref 4.0–10.5)
nRBC: 0 % (ref 0.0–0.2)
nRBC: 0 /100 WBC

## 2021-10-16 LAB — LIPASE, BLOOD: Lipase: 23 U/L (ref 11–51)

## 2021-10-16 LAB — AMYLASE: Amylase: 26 U/L — ABNORMAL LOW (ref 28–100)

## 2021-10-16 MED ORDER — HYDRALAZINE HCL 20 MG/ML IJ SOLN: 10.0000 mg | INTRAMUSCULAR | Status: DC | PRN

## 2021-10-16 MED ORDER — LABETALOL HCL 5 MG/ML IV SOLN: 80.0000 mg | INTRAVENOUS | Status: DC | PRN

## 2021-10-16 MED ORDER — LABETALOL HCL 5 MG/ML IV SOLN
20.0000 mg | INTRAVENOUS | Status: DC | PRN
  Filled 2021-10-16: qty 4

## 2021-10-16 MED ORDER — BISACODYL 10 MG RE SUPP
10.0000 mg | Freq: Every day | RECTAL | Status: DC | PRN
  Administered 2021-10-16 – 2021-10-17 (×2): 10 mg via RECTAL
  Filled 2021-10-16 (×2): qty 1

## 2021-10-16 MED ORDER — LABETALOL HCL 5 MG/ML IV SOLN: 40.0000 mg | INTRAVENOUS | Status: DC | PRN

## 2021-10-16 NOTE — Lactation Note (Signed)
This note was copied from a baby's chart. Lactation Consultation Note  Patient Name: [name] [id number] Date: 10/16/2021 Reason for consult: Follow-up assessment;Primapara;Term;1st time breastfeeding Age:49 days   LC Follow Up Consult:  Mother has had multiple health related concerns since delivery.  She has been formula feeding and has not been pumping.  She has informed me that her feeding preference is formula now.  Discussed suppression.  Mother's breasts are soft; no signs of engorgement.  Visitor present.   Maternal Data    Feeding Nipple Type: Slow - flow  LATCH Score                    Lactation Tools Discussed/Used    Interventions    Discharge Discharge Education: Engorgement and breast care  Consult Status Consult Status: Complete Date: 10/16/21 Follow-up type: Call as needed    Chelsea Pedretti R Deidrick Rainey 10/16/2021, 2:03 PM

## 2021-10-16 NOTE — Progress Notes (Signed)
Still no further emesis today, passed a little gas and small BM after dulcolax suppository.  Overall feels the same, still slightly distended  Afeb, VSS, BP stable  U/s of liver and GB c/w CT scan, some sludge and thickened wall  Will continue with bowel rest overnight and re-eval in am, discussed with pt

## 2021-10-16 NOTE — Progress Notes (Signed)
0600 medications given with sip of water (synthroid and procardia). Pt vomited 150cc emesis about 5 minutes afterward.

## 2021-10-16 NOTE — Progress Notes (Signed)
Patient ID: Connie Randall, female   DOB: 27-Jul-1971, 50 y.o.   MRN: [id number] Late entry  I received call from radiologist to review CT findings   Early ileus vs SBO observed; dilated loops of small bowel, air/fluid level 3.1 Inflammation of gallbladder and pancreas - consider gallbladder US Normal post op changes on uterus   I reviewed findings above with pt and explained plan to keep her NPO for now except for sips with meds   Pending progression/resolution of symptoms may need gallbladder workup in/out pt   Continue to monitor BP - labs wnl . Pt reassured no labs values concerning for  preE/HELLP at this time                 Due for 60xl procardia in am and consider continuing 30xl in pm   She reports no progression of her symptoms

## 2021-10-16 NOTE — Progress Notes (Signed)
No recent emesis Afeb, VSS, BP 140-150/80-90  Labs overall ok except WBC up to 33  No obvious source of infection, elevated WBC could just be from ileus.  Will get u/s of liver, GB and pancreas, dulcolax suppository.

## 2021-10-16 NOTE — Progress Notes (Signed)
POD #5 LTCS, ileus vs SBO Had more emesis this am, no flatus, no BM, pain ok Afeb, VSS, BP 140-150/80-90 Abd- soft, mod distension, fundus firm, incision intact  Will recheck CBC, CMP, amylase and lipase, continue bowel rest for now, might get u/s of GB and pancreas depending on labs.  Discussed with pt

## 2021-10-16 NOTE — Progress Notes (Signed)
Called CT at 2116 regarding what time pt would go for scan, told that there were a lot of stat CTs and RN would be notified when pt would be going to radiology. Pt down to CT at 2325, received call back around 2350 that pt's IV uncomfortable when they tried to use it and CT tried x2 to obtain new IV but unsuccessful. Dr. Terri Piedra called and stated it was okay to do scan without contrast while pt in CT. CT aware of plan. Pt back to room at 0010. Tried to contact Dr. Terri Piedra around [phone] regarding report. Noted afterwards that radiology called report to Dr. Terri Piedra. Spoke with Dr. Terri Piedra around 0115 and she will be up to see pt.

## 2021-10-17 LAB — CBC WITH DIFFERENTIAL/PLATELET
Abs Immature Granulocytes: 0.15 10*3/uL — ABNORMAL HIGH (ref 0.00–0.07)
Basophils Absolute: 0.1 10*3/uL (ref 0.0–0.1)
Basophils Relative: 0 %
Eosinophils Absolute: 0.1 10*3/uL (ref 0.0–0.5)
Eosinophils Relative: 0 %
HCT: 33.5 % — ABNORMAL LOW (ref 36.0–46.0)
Hemoglobin: 11.4 g/dL — ABNORMAL LOW (ref 12.0–15.0)
Immature Granulocytes: 1 %
Lymphocytes Relative: 6 %
Lymphs Abs: 1.3 10*3/uL (ref 0.7–4.0)
MCH: 29.8 pg (ref 26.0–34.0)
MCHC: 34 g/dL (ref 30.0–36.0)
MCV: 87.7 fL (ref 80.0–100.0)
Monocytes Absolute: 0.9 10*3/uL (ref 0.1–1.0)
Monocytes Relative: 4 %
Neutro Abs: 19.3 10*3/uL — ABNORMAL HIGH (ref 1.7–7.7)
Neutrophils Relative %: 89 %
Platelets: 412 10*3/uL — ABNORMAL HIGH (ref 150–400)
RBC: 3.82 MIL/uL — ABNORMAL LOW (ref 3.87–5.11)
RDW: 13.5 % (ref 11.5–15.5)
WBC: 21.8 10*3/uL — ABNORMAL HIGH (ref 4.0–10.5)
nRBC: 0 % (ref 0.0–0.2)

## 2021-10-17 MED ORDER — LABETALOL HCL 5 MG/ML IV SOLN
20.0000 mg | Freq: Once | INTRAVENOUS | Status: AC
  Administered 2021-10-17: 20 mg via INTRAVENOUS

## 2021-10-17 NOTE — Progress Notes (Signed)
Patient is NPO, nausea improved, no vomiting since approximately 6am yesterday.  She reports persistent abdominal distension, small amount of gas and small amount of stool after use of dulcolax suppository, and persistent RUQ pain.  Appropriate lochia. Vitals:   10/16/21 1645 10/17/21 0012 10/17/21 0338 10/17/21 0452  BP: (!) 149/85 (!) 151/95 (!) 153/82 126/82  Pulse: (!) 109 (!) 111 100 89  Resp: (!) 23  20   Temp: 98.4 F (36.9 C)  98.5 F (36.9 C)   TempSrc: Oral  Oral   SpO2: 99%     Weight:      Height:        Gen: NAD, appears comfortable and conversant Abd: distended, TTP RUQ Inc: c/d/I Ext: no calf tenderness   Lab Results  Component Value Date   WBC 21.8 (H) 10/17/2021   HGB 11.4 (L) 10/17/2021   HCT 33.5 (L) 10/17/2021   MCV 87.7 10/17/2021   PLT 412 (H) 10/17/2021    --/--/O POS (11/09 0121)  A/P Post op day #6 from primary C/S for failed induction 1) Ileus vs SBO and GB inflammation: Pt started to have GI symptoms 11/13 evening with emesis totaling 750+ cc.  She vomited again 11/14 approx 150cc at 6am. No emesis since then, now >24 hrs. CT 11/13 showed ileus vs SBO with GB showing enlargement, fat stranding, opacities c/w sludge/small stone, portal edema and GB wall thickening.  Fat stranding at pancreatic head as well.  Amylase/Lipase/ALT/AST have been wnl. [place] 11/14 showed GB with echogenic material and thickened wall.  She has been NPO using simethicone and dulcolax, ambulation has been encouraged.  Will cont. NPO with IVF until additional indication of GI recovery with flatus/BMs/reduced distension/improved appetite.  Per Dr. Willis Modena, pt has relative who is GI doctor and wondered if GI consult necessary, discussed my recommendation of f/u with general surgery, she requested consult while inpatient. Pt has remained afebrile and and WBCs have dropped since yesterday. 2) BPs: latest BP 126/82, overnight 145-153/82-95.  Has received IV labetalol.  Continue Procardia XL  30mg  daily, increase as needed. 3) Hypothyroid, continue current medication  Routine care.    Allyn Kenner

## 2021-10-17 NOTE — Progress Notes (Signed)
MD notified of patients elevated blood pressures. Q4 blood pressures are trending in the 150s over mid 80s-90s. Given 20 mg of Labetalol IV once and continue with scheduled Procardia per MD verbal order.

## 2021-10-17 NOTE — Consult Note (Signed)
Consulting Physician: Nickola Major Karisha Marlin  Referring Provider: Dr. Rogue Bussing (OBGYN)  Chief Complaint: Induction of labor  Reason for Consult: Cholecystitis   Subjective   HPI: Connie Randall is an 50 y.o. female who is here for induction of labor.  She is s/p c-section after failed induction on 10/12/21.  Postoperatively she has dealt with abdominal distention and right upper quadrant pain.  She has had slow return of bowel function.  CT and RUQ [place] were completed.  There was some concern for cholecystitis so a surgery consultation was placed.  She has had abdominal discomfort all over the abdomen after eating pulled pork in the past but never had consistent post-prandial right upper quadrant abdominal pain after eating.  She has not had a bowel movement since delivery.  She has had nausea and vomiting and abdominal distention since delivery.  In the last 12 hours or so she has started to feel a little bit better from a general GI standpoint - passing some flatus, small bowel movement.  But the right upper quadrant abdominal pain continues to be significant.  Past Medical History:  Diagnosis Date   Anemia    Anxiety    situational anxiety only no treatment   PONV (postoperative nausea and vomiting)    Thyroid carcinoma ([place])    Thyroid mass     Past Surgical History:  Procedure Laterality Date   CESAREAN SECTION N/A 10/11/2021   Procedure: CESAREAN SECTION;  Surgeon: Jerelyn Charles, MD;  Location: [facility];  Service: Obstetrics;  Laterality: N/A;   DIAGNOSTIC LAPAROSCOPY     DILATION AND CURETTAGE OF UTERUS     DILATION AND EVACUATION  10/31/2011   Procedure: DILATATION AND EVACUATION (D&E);  Surgeon: Marcial Pacas;  Location: [facility];  Service: Gynecology;;   egg retrieval     THYROIDECTOMY  08/03/2016   THYROIDECTOMY Left 08/03/2016   Procedure: THYROIDECTOMY;  Surgeon: Izora Gala, MD;  Location: [facility] OR;  Service: ENT;  Laterality: Left;  Left thyroid lobectomy with frozen  section   THYROIDECTOMY  09/19/2016   THYROIDECTOMY N/A 09/19/2016   Procedure: COMPLETION THYROIDECTOMY;  Surgeon: Izora Gala, MD;  Location: [facility] OR;  Service: ENT;  Laterality: N/A;    Family History  Problem Relation Age of Onset   Hypertension Mother    Hypercholesterolemia Mother    Cancer Mother    Hypertension Father    Hypercholesterolemia Father     Social:  reports that she has never smoked. She has never used smokeless tobacco. She reports that she does not currently use alcohol. She reports that she does not use drugs.  Allergies:  Allergies  Allergen Reactions   No Known Allergies     Medications: Current Outpatient Medications  Medication Instructions   ferrous sulfate 325 mg, Oral, Daily with breakfast   HYDROcodone-acetaminophen (NORCO) 7.5-325 MG tablet 1 tablet, Oral, Every 6 hours PRN   levothyroxine (SYNTHROID) 100 mcg, Oral, Daily   OVER THE COUNTER MEDICATION 2 tablets, Oral, Daily, LIVCO (Schisandra, Rosemary and Milk Thistle combination)   promethazine (PHENERGAN) 25 mg, Rectal, Every 6 hours PRN    ROS - all of the below systems have been reviewed with the patient and positives are indicated with bold text General: chills, fever or night sweats Eyes: blurry vision or double vision ENT: epistaxis or sore throat Allergy/Immunology: itchy/watery eyes or nasal congestion Hematologic/Lymphatic: bleeding problems, blood clots or swollen lymph nodes Endocrine: temperature intolerance or unexpected weight changes Breast: new or changing breast  lumps or nipple discharge Resp: cough, shortness of breath, or wheezing CV: chest pain or dyspnea on exertion GI: as per HPI GU: dysuria, trouble voiding, or hematuria MSK: joint pain or joint stiffness Neuro: TIA or stroke symptoms Derm: pruritus and skin lesion changes Psych: anxiety and depression  Objective   PE Blood pressure 135/77, pulse (!) 107, temperature 98.2 F (36.8 C), temperature source Oral,  resp. rate 17, height 5\' 4"  (1.626 m), weight 93.2 kg, SpO2 99 %, unknown if currently breastfeeding. Constitutional: NAD; conversant; no deformities Eyes: Moist conjunctiva; no lid lag; anicteric; PERRL Neck: Trachea midline; no thyromegaly Lungs: Normal respiratory effort; no tactile fremitus CV: RRR; no palpable thrills; no pitting edema GI: Abd Severe tenderness in the right upper quadrant; mild to moderate abdominal distention, c-section incision bandaged MSK: Normal range of motion of extremities; no clubbing/cyanosis Psychiatric: Appropriate affect; alert and oriented x3 Lymphatic: No palpable cervical or axillary lymphadenopathy  Results for orders placed or performed during the hospital encounter of 10/11/21 (from the past 24 hour(s))  CBC with Differential/Platelet     Status: Abnormal   Collection Time: 10/17/21  7:18 AM  Result Value Ref Range   WBC 21.8 (H) 4.0 - 10.5 K/uL   RBC 3.82 (L) 3.87 - 5.11 MIL/uL   Hemoglobin 11.4 (L) 12.0 - 15.0 g/dL   HCT 33.5 (L) 36.0 - 46.0 %   MCV 87.7 80.0 - 100.0 fL   MCH 29.8 26.0 - 34.0 pg   MCHC 34.0 30.0 - 36.0 g/dL   RDW 13.5 11.5 - 15.5 %   Platelets 412 (H) 150 - 400 K/uL   nRBC 0.0 0.0 - 0.2 %   Neutrophils Relative % 89 %   Neutro Abs 19.3 (H) 1.7 - 7.7 K/uL   Lymphocytes Relative 6 %   Lymphs Abs 1.3 0.7 - 4.0 K/uL   Monocytes Relative 4 %   Monocytes Absolute 0.9 0.1 - 1.0 K/uL   Eosinophils Relative 0 %   Eosinophils Absolute 0.1 0.0 - 0.5 K/uL   Basophils Relative 0 %   Basophils Absolute 0.1 0.0 - 0.1 K/uL   Immature Granulocytes 1 %   Abs Immature Granulocytes 0.15 (H) 0.00 - 0.07 K/uL     Imaging Orders         CT ABDOMEN LIMITED WO CONTRAST    1. Slightly distended loops of small bowel in the abdomen with scattered air-fluid levels, possible ileus versus early or partial small bowel obstruction. 2. Distended gallbladder with surrounding inflammatory changes and periportal edema. Ultrasound is recommended for  further evaluation. 3. Mild fat stranding adjacent to the pancreatic head which may be related to local inflammatory changes. Correlation with amylase and lipase is suggested to exclude superimposed pancreatitis. 4. Small amount of free fluid in the right upper quadrant. 5. Subcutaneous air in the lower abdomen, may be iatrogenic.       US Abdomen Limited RUQ (LIVER/GB)    Gallbladder appears to be filled with echogenic material. There is no demonstrable acoustic shadowing. There is small amount of fluid adjacent to the gallbladder. There is wall thickening in gallbladder measuring 3.5 mm. Technologist observed tenderness over the gallbladder during the study. Common bile duct: Diameter: 4 mm Liver:  No focal lesion identified. Within normal limits in parenchymal echogenicity. Portal vein is patent. Other: None. IMPRESSION: Gallbladder appears to be filled with echogenic material suggesting presence of sludge and possibly tiny stones. There is wall thickening in gallbladder. Technologist observed tenderness over the gallbladder  during the study. Possibility of acute cholecystitis is not excluded. There is no dilation of bile ducts.    Assessment and Plan   Connie Randall is an 50 y.o. female, 5 days post partum, requiring surgery consultation for possible cholecystitis.  It looks that she may have cholecystitis and may have had a component of pancreatitis though lipase was never found to be elevated.  In general, her symptoms are improving while NPO, though she still has significant right upper quadrant tenderness.  I recommend continued supportive care and following clinically.  She may benefit from HIDA scan or additional testing or interventions if she fails to continue to improve.     Felicie Morn, MD  [facility] Use [email or web address] to contact on call provider

## 2021-10-18 ENCOUNTER — Inpatient Hospital Stay (HOSPITAL_COMMUNITY): Payer: BC Managed Care – PPO

## 2021-10-18 LAB — COMPREHENSIVE METABOLIC PANEL
ALT: 18 U/L (ref 0–44)
AST: 14 U/L — ABNORMAL LOW (ref 15–41)
Albumin: 2 g/dL — ABNORMAL LOW (ref 3.5–5.0)
Alkaline Phosphatase: 139 U/L — ABNORMAL HIGH (ref 38–126)
Anion gap: 10 (ref 5–15)
BUN: 8 mg/dL (ref 6–20)
CO2: 22 mmol/L (ref 22–32)
Calcium: 7.9 mg/dL — ABNORMAL LOW (ref 8.9–10.3)
Chloride: 102 mmol/L (ref 98–111)
Creatinine, Ser: 0.72 mg/dL (ref 0.44–1.00)
GFR, Estimated: >60 mL/min (ref >60)
Glucose, Bld: 78 mg/dL (ref 70–99)
Potassium: 3.2 mmol/L — ABNORMAL LOW (ref 3.5–5.1)
Sodium: 134 mmol/L — ABNORMAL LOW (ref 135–145)
Total Bilirubin: 0.9 mg/dL (ref 0.3–1.2)
Total Protein: 6.3 g/dL — ABNORMAL LOW (ref 6.5–8.1)

## 2021-10-18 LAB — CBC
HCT: 37 % (ref 36.0–46.0)
Hemoglobin: 12.4 g/dL (ref 12.0–15.0)
MCH: 29.2 pg (ref 26.0–34.0)
MCHC: 33.5 g/dL (ref 30.0–36.0)
MCV: 87.3 fL (ref 80.0–100.0)
Platelets: 469 10*3/uL — ABNORMAL HIGH (ref 150–400)
RBC: 4.24 MIL/uL (ref 3.87–5.11)
RDW: 13.6 % (ref 11.5–15.5)
WBC: 17.2 10*3/uL — ABNORMAL HIGH (ref 4.0–10.5)
nRBC: 0 % (ref 0.0–0.2)

## 2021-10-18 MED ORDER — PIPERACILLIN-TAZOBACTAM 3.375 G IVPB
3.3750 g | Freq: Three times a day (TID) | INTRAVENOUS | Status: DC
  Administered 2021-10-18 – 2021-10-21 (×8): 3.375 g via INTRAVENOUS
  Filled 2021-10-18 (×10): qty 50

## 2021-10-18 MED ORDER — NIFEDIPINE ER OSMOTIC RELEASE 30 MG PO TB24
30.0000 mg | ORAL_TABLET | Freq: Two times a day (BID) | ORAL | Status: DC
  Administered 2021-10-18 – 2021-10-21 (×6): 30 mg via ORAL
  Filled 2021-10-18 (×6): qty 1

## 2021-10-18 MED ORDER — TECHNETIUM TC 99M MEBROFENIN IV KIT
5.3000 | PACK | Freq: Once | INTRAVENOUS | Status: AC | PRN
  Administered 2021-10-18: 5.3 via INTRAVENOUS

## 2021-10-18 MED ORDER — MORPHINE SULFATE (PF) 4 MG/ML IV SOLN
3.0000 mg | Freq: Once | INTRAVENOUS | Status: AC
  Administered 2021-10-18: 3 mg via INTRAVENOUS
  Filled 2021-10-18: qty 1

## 2021-10-18 NOTE — Progress Notes (Addendum)
Subjective: Postpartum Day 7: Cesarean Delivery Patient reports + flatus and no problems voiding.  She feels less abdominal distension but continues to have moderate to severe RUQ pain. She denies HA, scotomata, SOB or CP. Lochia is scant. Pain well controlled at incision site unless getting in/out of bed . Ambulated in hallways earlier today with no issues   Objective: Vital signs in last 24 hours: Temp:  [98.1 F (36.7 C)-98.5 F (36.9 C)] 98.1 F (36.7 C) (11/16 0940) Pulse Rate:  [96-107] 99 (11/16 0940) Resp:  [17-20] 18 (11/16 0940) BP: (130-139)/(77-92) 139/86 (11/16 0940) SpO2:  [96 %] 96 % (11/16 0940)  Physical Exam:  General: alert, cooperative, and no distress Lochia: appropriate Uterine Fundus: firm however abdomen still moderately distended and tympanic- tender in RUQ  Incision: honeycomb removed, steristrips in place, no drainage, healing well DVT Evaluation: No evidence of DVT seen on physical exam.  Recent Labs    10/17/21 0718 10/18/21 0759  HGB 11.4* 12.4  HCT 33.5* 37.0    Assessment/Plan: Status post Cesarean section. Postoperative course complicated by Ileus vs SBO - NPO since 10/15/21. No further emesis since 10/16/21.  . RUQ pain - possible cholecystitis : followed by General Surgery                   - HIDA scan ordered today                  - discussed considering advancing to clear diet following scan  PIH- BP stable on procardia 30xl. Pt asymptomatic. Continued BP monitoring Incisional discomfort - abdominal binder ordered  Venetia Night Mechell Girgis 10/18/2021, 11:50 AM

## 2021-10-18 NOTE — Progress Notes (Signed)
Progress Note  7 Days Post-Op  Subjective: Patient reports continued RUQ pain but reports it is improved some from yesterday. She has not been on a diet and last episode of emesis was yesterday and was bilious. She is passing some flatus but has not had a BM.  Objective: Vital signs in last 24 hours: Temp:  [98.1 F (36.7 C)-98.5 F (36.9 C)] 98.1 F (36.7 C) (11/16 0402) Pulse Rate:  [96-107] 96 (11/16 0402) Resp:  [17-20] 20 (11/16 0402) BP: (130-138)/(77-92) 138/92 (11/16 0402)    Intake/Output from previous day: No intake/output data recorded. Intake/Output this shift: No intake/output data recorded.  PE: General: pleasant, WD, WN female who is laying in bed in NAD HEENT: Sclera are anicteric Heart: regular, rate, and rhythm.   Lungs: Respiratory effort nonlabored Abd: soft, ttp in RUQ, ND, BS hypoactive  Psych: A&Ox3 with an appropriate affect.    Lab Results:  Recent Labs    10/17/21 0718 10/18/21 0759  WBC 21.8* 17.2*  HGB 11.4* 12.4  HCT 33.5* 37.0  PLT 412* 469*   BMET Recent Labs    10/16/21 1023 10/18/21 0759  NA 132* 134*  K 3.6 3.2*  CL 99 102  CO2 21* 22  GLUCOSE 122* 78  BUN 5* 8  CREATININE 0.83 0.72  CALCIUM 8.6* 7.9*   PT/INR No results for input(s): LABPROT, INR in the last 72 hours. CMP     Component Value Date/Time   NA 134 (L) 10/18/2021 0759   K 3.2 (L) 10/18/2021 0759   CL 102 10/18/2021 0759   CO2 22 10/18/2021 0759   GLUCOSE 78 10/18/2021 0759   BUN 8 10/18/2021 0759   CREATININE 0.72 10/18/2021 0759   CALCIUM 7.9 (L) 10/18/2021 0759   PROT 6.3 (L) 10/18/2021 0759   ALBUMIN 2.0 (L) 10/18/2021 0759   AST 14 (L) 10/18/2021 0759   ALT 18 10/18/2021 0759   ALKPHOS 139 (H) 10/18/2021 0759   BILITOT 0.9 10/18/2021 0759   GFRNONAA >60 10/18/2021 0759   GFRAA >60 09/12/2016 1555   Lipase     Component Value Date/Time   LIPASE 23 10/16/2021 1023       Studies/Results: US Abdomen Limited RUQ  (LIVER/GB)  Result Date: 10/16/2021 CLINICAL DATA:  Abnormal findings in the CT EXAM: ULTRASOUND ABDOMEN LIMITED RIGHT UPPER QUADRANT COMPARISON:  CT abdomen done on 10/16/2021 FINDINGS: Gallbladder: Gallbladder appears to be filled with echogenic material. There is no demonstrable acoustic shadowing. There is small amount of fluid adjacent to the gallbladder. There is wall thickening in gallbladder measuring 3.5 mm. Technologist observed tenderness over the gallbladder during the study. Common bile duct: Diameter: 4 mm Liver: No focal lesion identified. Within normal limits in parenchymal echogenicity. Portal vein is patent. Other: None. IMPRESSION: Gallbladder appears to be filled with echogenic material suggesting presence of sludge and possibly tiny stones. There is wall thickening in gallbladder. Technologist observed tenderness over the gallbladder during the study. Possibility of acute cholecystitis is not excluded. There is no dilation of bile ducts. Electronically Signed   By: Elmer Picker M.D.   On: 10/16/2021 13:53    Anti-infectives: Anti-infectives (From admission, onward)    Start     Dose/Rate Route Frequency Ordered Stop   10/11/21 2330  ceFAZolin (ANCEF) IVPB 2g/100 mL premix        2 g 200 mL/hr over 30 Minutes Intravenous  Once 10/11/21 2233 10/11/21 2330   10/11/21 2330  azithromycin (ZITHROMAX) 500 mg in sodium chloride  0.9 % 250 mL IVPB        500 mg 250 mL/hr over 60 Minutes Intravenous  Once 10/11/21 2233 10/12/21 0304        Assessment/Plan RUQ abdominal pain Possible Cholecystitis  - symptoms have been improving but RUQ [place] with GB wall thickening  - WBC improving, 17 today from 33 11/4, afebrile - has not been on a diet with ileus s/p cesarean - ttp in RUQ on exam - recommend HIDA today - further recommendations pending results   FEN: NPO, IVF@125  cc/h VTE: SCDs ID: no current abx  S/P cesarean section - per OB Hx of thyroid cancer   LOS: 7 days     Norm Parcel, [facility] 10/18/2021, 10:07 AM Please see Amion for pager number during day hours 7:00am-4:30pm

## 2021-10-18 NOTE — Progress Notes (Signed)
Updated Dr. Terri Piedra on surgeon's plan and blood pressures for the day. Dr. Terri Piedra changed order from calling in BP >150/95 to wanting to be notified if BP >or = to 135/85. Procardia order increased to BID instead of daily now. Maxwell Caul, [facility] [place]

## 2021-10-18 NOTE — Progress Notes (Signed)
Patient ID: Connie Randall, female   DOB: 1971-02-02, 50 y.o.   MRN: [id number]   HIDA scan positive for acute cholecystitis.  Patient has had some ileus for the last few days - bloating, minimal flatus, no nausea or vomiting.  Still with significant RUQ pain.  Plan laparoscopic cholecystectomy with intraoperative cholangiogram tomorrow.  The surgical procedure has been discussed with the patient.  Potential risks, benefits, alternative treatments, and expected outcomes have been explained.  All of the patient's questions at this time have been answered.  The likelihood of reaching the patient's treatment goal is good.  The patient understand the proposed surgical procedure and wishes to proceed.   Imogene Burn. Georgette Dover, MD, [facility]  General Surgery   10/18/2021 5:22 PM

## 2021-10-18 NOTE — H&P (View-Only) (Signed)
Patient ID: Connie Randall, female   DOB: 1971-02-20, 50 y.o.   MRN: [id number]   HIDA scan positive for acute cholecystitis.  Patient has had some ileus for the last few days - bloating, minimal flatus, no nausea or vomiting.  Still with significant RUQ pain.  Plan laparoscopic cholecystectomy with intraoperative cholangiogram tomorrow.  The surgical procedure has been discussed with the patient.  Potential risks, benefits, alternative treatments, and expected outcomes have been explained.  All of the patient's questions at this time have been answered.  The likelihood of reaching the patient's treatment goal is good.  The patient understand the proposed surgical procedure and wishes to proceed.   Imogene Burn. Georgette Dover, MD, [facility] Surgery  General Surgery   10/18/2021 5:22 PM

## 2021-10-19 ENCOUNTER — Other Ambulatory Visit: Payer: Self-pay

## 2021-10-19 ENCOUNTER — Encounter (HOSPITAL_COMMUNITY): Payer: Self-pay | Admitting: Obstetrics and Gynecology

## 2021-10-19 ENCOUNTER — Inpatient Hospital Stay (HOSPITAL_COMMUNITY): Payer: BC Managed Care – PPO | Admitting: Anesthesiology

## 2021-10-19 ENCOUNTER — Encounter (HOSPITAL_COMMUNITY)
Admission: AD | Disposition: A | Discharge status: Home / Self Care | Payer: Self-pay | Source: Home / Self Care | Attending: Obstetrics

## 2021-10-19 DIAGNOSIS — K810 Acute cholecystitis: Secondary | ICD-10-CM | POA: Diagnosis present

## 2021-10-19 HISTORY — PX: CHOLECYSTECTOMY: SHX55

## 2021-10-19 SURGERY — LAPAROSCOPIC CHOLECYSTECTOMY
Anesthesia: General | Site: Abdomen

## 2021-10-19 MED ORDER — SUCCINYLCHOLINE CHLORIDE 200 MG/10ML IV SOSY
PREFILLED_SYRINGE | INTRAVENOUS | Status: DC | PRN
  Administered 2021-10-19: 180 mg via INTRAVENOUS

## 2021-10-19 MED ORDER — DEXAMETHASONE SODIUM PHOSPHATE 10 MG/ML IJ SOLN
INTRAMUSCULAR | Status: DC | PRN
  Administered 2021-10-19: 10 mg via INTRAVENOUS

## 2021-10-19 MED ORDER — ONDANSETRON HCL 4 MG/2ML IJ SOLN
INTRAMUSCULAR | Status: AC
  Filled 2021-10-19: qty 2

## 2021-10-19 MED ORDER — NIFEDIPINE ER OSMOTIC RELEASE 30 MG PO TB24: 30.0000 mg | ORAL_TABLET | Freq: Once | ORAL | Status: DC

## 2021-10-19 MED ORDER — LIDOCAINE 2% (20 MG/ML) 5 ML SYRINGE
INTRAMUSCULAR | Status: DC | PRN
  Administered 2021-10-19: 60 mg via INTRAVENOUS

## 2021-10-19 MED ORDER — SUGAMMADEX SODIUM 200 MG/2ML IV SOLN
INTRAVENOUS | Status: DC | PRN
  Administered 2021-10-19 (×2): 200 mg via INTRAVENOUS

## 2021-10-19 MED ORDER — ROCURONIUM BROMIDE 10 MG/ML (PF) SYRINGE
PREFILLED_SYRINGE | INTRAVENOUS | Status: DC | PRN
  Administered 2021-10-19 (×2): 10 mg via INTRAVENOUS
  Administered 2021-10-19: 50 mg via INTRAVENOUS

## 2021-10-19 MED ORDER — BUPIVACAINE-EPINEPHRINE (PF) 0.25% -1:200000 IJ SOLN
INTRAMUSCULAR | Status: AC
  Filled 2021-10-19: qty 30

## 2021-10-19 MED ORDER — 0.9 % SODIUM CHLORIDE (POUR BTL) OPTIME
TOPICAL | Status: DC | PRN
  Administered 2021-10-19: 12:00:00 1000 mL

## 2021-10-19 MED ORDER — SUCCINYLCHOLINE CHLORIDE 200 MG/10ML IV SOSY
PREFILLED_SYRINGE | INTRAVENOUS | Status: AC
  Filled 2021-10-19: qty 10

## 2021-10-19 MED ORDER — SCOPOLAMINE 1 MG/3DAYS TD PT72
1.0000 | MEDICATED_PATCH | TRANSDERMAL | Status: DC
  Administered 2021-10-19: 11:00:00 1.5 mg via TRANSDERMAL

## 2021-10-19 MED ORDER — ONDANSETRON HCL 4 MG/2ML IJ SOLN: 4.0000 mg | Freq: Four times a day (QID) | INTRAMUSCULAR | Status: DC | PRN

## 2021-10-19 MED ORDER — APREPITANT 40 MG PO CAPS: 40.0000 mg | ORAL_CAPSULE | Freq: Once | ORAL | Status: AC

## 2021-10-19 MED ORDER — PROPOFOL 1000 MG/100ML IV EMUL
INTRAVENOUS | Status: AC
  Filled 2021-10-19: qty 100

## 2021-10-19 MED ORDER — LIDOCAINE 2% (20 MG/ML) 5 ML SYRINGE
INTRAMUSCULAR | Status: AC
  Filled 2021-10-19: qty 5

## 2021-10-19 MED ORDER — BUPIVACAINE-EPINEPHRINE 0.25% -1:200000 IJ SOLN
INTRAMUSCULAR | Status: DC | PRN
  Administered 2021-10-19: 10 mL

## 2021-10-19 MED ORDER — FENTANYL CITRATE (PF) 250 MCG/5ML IJ SOLN
INTRAMUSCULAR | Status: AC
  Filled 2021-10-19: qty 5

## 2021-10-19 MED ORDER — FENTANYL CITRATE (PF) 250 MCG/5ML IJ SOLN
INTRAMUSCULAR | Status: DC | PRN
  Administered 2021-10-19 (×2): 50 ug via INTRAVENOUS
  Administered 2021-10-19: 25 ug via INTRAVENOUS
  Administered 2021-10-19: 100 ug via INTRAVENOUS
  Administered 2021-10-19: 25 ug via INTRAVENOUS

## 2021-10-19 MED ORDER — ONDANSETRON HCL 4 MG/2ML IJ SOLN
INTRAMUSCULAR | Status: DC | PRN
  Administered 2021-10-19: 4 mg via INTRAVENOUS

## 2021-10-19 MED ORDER — PROPOFOL 10 MG/ML IV BOLUS
INTRAVENOUS | Status: DC | PRN
  Administered 2021-10-19 (×2): 15 mg via INTRAVENOUS
  Administered 2021-10-19: 50 mg via INTRAVENOUS
  Administered 2021-10-19: 150 mg via INTRAVENOUS
  Administered 2021-10-19: 15 mg via INTRAVENOUS

## 2021-10-19 MED ORDER — SODIUM CHLORIDE 0.9 % IR SOLN
Status: DC | PRN
  Administered 2021-10-19: 1000 mL

## 2021-10-19 MED ORDER — ORAL CARE MOUTH RINSE: 15.0000 mL | Freq: Once | OROMUCOSAL | Status: AC

## 2021-10-19 MED ORDER — CHLORHEXIDINE GLUCONATE 0.12 % MT SOLN: 15.0000 mL | Freq: Once | OROMUCOSAL | Status: AC

## 2021-10-19 MED ORDER — FENTANYL CITRATE (PF) 100 MCG/2ML IJ SOLN: 25.0000 ug | INTRAMUSCULAR | Status: DC | PRN

## 2021-10-19 MED ORDER — HEMOSTATIC AGENTS (NO CHARGE) OPTIME
TOPICAL | Status: DC | PRN
  Administered 2021-10-19: 1 via TOPICAL

## 2021-10-19 MED ORDER — LACTATED RINGERS IV SOLN: INTRAVENOUS | Status: DC

## 2021-10-19 MED ORDER — APREPITANT 40 MG PO CAPS
ORAL_CAPSULE | ORAL | Status: AC
  Administered 2021-10-19: 11:00:00 40 mg via ORAL
  Filled 2021-10-19: qty 1

## 2021-10-19 MED ORDER — PROPOFOL 10 MG/ML IV BOLUS
INTRAVENOUS | Status: AC
  Filled 2021-10-19: qty 40

## 2021-10-19 MED ORDER — ONDANSETRON HCL 4 MG PO TABS: 4.0000 mg | ORAL_TABLET | Freq: Four times a day (QID) | ORAL | Status: DC | PRN

## 2021-10-19 MED ORDER — PROPOFOL 500 MG/50ML IV EMUL
INTRAVENOUS | Status: DC | PRN
  Administered 2021-10-19: 150 ug/kg/min via INTRAVENOUS

## 2021-10-19 MED ORDER — ACETAMINOPHEN 500 MG PO TABS: 1000.0000 mg | ORAL_TABLET | Freq: Once | ORAL | Status: DC

## 2021-10-19 MED ORDER — HYDROMORPHONE HCL 1 MG/ML IJ SOLN: 0.5000 mg | INTRAMUSCULAR | Status: DC | PRN

## 2021-10-19 MED ORDER — CHLORHEXIDINE GLUCONATE 0.12 % MT SOLN
OROMUCOSAL | Status: AC
  Administered 2021-10-19: 10:00:00 15 mL via OROMUCOSAL
  Filled 2021-10-19: qty 15

## 2021-10-19 MED ORDER — MIDAZOLAM HCL 2 MG/2ML IJ SOLN
INTRAMUSCULAR | Status: AC
  Filled 2021-10-19: qty 2

## 2021-10-19 MED ORDER — MIDAZOLAM HCL 5 MG/5ML IJ SOLN
INTRAMUSCULAR | Status: DC | PRN
Start: 2021-10-19 — End: 2021-10-19
  Administered 2021-10-19 (×2): 1 mg via INTRAVENOUS

## 2021-10-19 MED ORDER — ROCURONIUM BROMIDE 10 MG/ML (PF) SYRINGE
PREFILLED_SYRINGE | INTRAVENOUS | Status: AC
  Filled 2021-10-19: qty 10

## 2021-10-19 MED ORDER — DEXAMETHASONE SODIUM PHOSPHATE 10 MG/ML IJ SOLN
INTRAMUSCULAR | Status: AC
  Filled 2021-10-19: qty 1

## 2021-10-19 SURGICAL SUPPLY — 46 items
APPLIER CLIP ROT 10 11.4 M/L (STAPLE) ×3
BAG COUNTER SPONGE SURGICOUNT (BAG) ×3 IMPLANT
BENZOIN TINCTURE PRP APPL 2/3 (GAUZE/BANDAGES/DRESSINGS) ×3 IMPLANT
BLADE CLIPPER SURG (BLADE) IMPLANT
CANISTER SUCT 3000ML PPV (MISCELLANEOUS) ×3 IMPLANT
CHLORAPREP W/TINT 26 (MISCELLANEOUS) ×3 IMPLANT
CLIP APPLIE ROT 10 11.4 M/L (STAPLE) ×2 IMPLANT
COVER MAYO STAND STRL (DRAPES) ×3 IMPLANT
COVER SURGICAL LIGHT HANDLE (MISCELLANEOUS) ×3 IMPLANT
DRAIN CHANNEL 19F RND (DRAIN) ×2 IMPLANT
DRAPE C-ARM 42X120 X-RAY (DRAPES) ×3 IMPLANT
DRSG TEGADERM 2-3/8X2-3/4 SM (GAUZE/BANDAGES/DRESSINGS) ×9 IMPLANT
DRSG TEGADERM 4X4.75 (GAUZE/BANDAGES/DRESSINGS) ×3 IMPLANT
ELECT REM PT RETURN 9FT ADLT (ELECTROSURGICAL) ×3
ELECTRODE REM PT RTRN 9FT ADLT (ELECTROSURGICAL) ×2 IMPLANT
EVACUATOR SILICONE 100CC (DRAIN) ×2 IMPLANT
GAUZE SPONGE 2X2 8PLY STRL LF (GAUZE/BANDAGES/DRESSINGS) ×2 IMPLANT
GLOVE SURG ENC MOIS LTX SZ7 (GLOVE) ×3 IMPLANT
GLOVE SURG UNDER POLY LF SZ7.5 (GLOVE) ×3 IMPLANT
GOWN STRL REUS W/ TWL LRG LVL3 (GOWN DISPOSABLE) ×6 IMPLANT
GOWN STRL REUS W/TWL LRG LVL3 (GOWN DISPOSABLE) ×6
HEMOSTAT SNOW SURGICEL 2X4 (HEMOSTASIS) ×2 IMPLANT
KIT BASIN OR (CUSTOM PROCEDURE TRAY) ×3 IMPLANT
KIT TURNOVER KIT B (KITS) ×3 IMPLANT
NS IRRIG 1000ML POUR BTL (IV SOLUTION) ×3 IMPLANT
PAD ARMBOARD 7.5X6 YLW CONV (MISCELLANEOUS) ×3 IMPLANT
POUCH RETRIEVAL ECOSAC 10 (ENDOMECHANICALS) ×1 IMPLANT
POUCH RETRIEVAL ECOSAC 10MM (ENDOMECHANICALS) ×3
POUCH SPECIMEN RETRIEVAL 10MM (ENDOMECHANICALS) IMPLANT
SCISSORS LAP 5X35 DISP (ENDOMECHANICALS) ×3 IMPLANT
SET CHOLANGIOGRAPH 5 50 .035 (SET/KITS/TRAYS/PACK) ×3 IMPLANT
SET IRRIG TUBING LAPAROSCOPIC (IRRIGATION / IRRIGATOR) ×3 IMPLANT
SET TUBE SMOKE EVAC HIGH FLOW (TUBING) ×3 IMPLANT
SLEEVE ENDOPATH XCEL 5M (ENDOMECHANICALS) ×3 IMPLANT
SPECIMEN JAR SMALL (MISCELLANEOUS) ×3 IMPLANT
SPONGE GAUZE 2X2 STER 10/PKG (GAUZE/BANDAGES/DRESSINGS) ×1
STRIP CLOSURE SKIN 1/2X4 (GAUZE/BANDAGES/DRESSINGS) ×3 IMPLANT
SUT ETHILON 2 0 FS 18 (SUTURE) ×2 IMPLANT
SUT MNCRL AB 4-0 PS2 18 (SUTURE) ×3 IMPLANT
TOWEL GREEN STERILE (TOWEL DISPOSABLE) ×3 IMPLANT
TOWEL GREEN STERILE FF (TOWEL DISPOSABLE) ×3 IMPLANT
TRAY LAPAROSCOPIC MC (CUSTOM PROCEDURE TRAY) ×3 IMPLANT
TROCAR XCEL BLUNT TIP 100MML (ENDOMECHANICALS) ×3 IMPLANT
TROCAR XCEL NON-BLD 11X100MML (ENDOMECHANICALS) ×3 IMPLANT
TROCAR XCEL NON-BLD 5MMX100MML (ENDOMECHANICALS) ×3 IMPLANT
WATER STERILE IRR 1000ML POUR (IV SOLUTION) ×3 IMPLANT

## 2021-10-19 NOTE — Transfer of Care (Signed)
Immediate Anesthesia Transfer of Care Note  Patient: Connie Randall  Procedure(s) Performed: LAPAROSCOPIC CHOLECYSTECTOMY (Abdomen)  Patient Location: PACU  Anesthesia Type:General  Level of Consciousness: drowsy and patient cooperative  Airway & Oxygen Therapy: Patient Spontanous Breathing and Patient connected to face mask oxygen  Post-op Assessment: Report given to RN and Post -op Vital signs reviewed and stable  Post vital signs: Reviewed and stable  Last Vitals:  Vitals Value Taken Time  BP 140/84 10/19/21 1346  Temp    Pulse 99 10/19/21 1350  Resp 22 10/19/21 1348  SpO2 89 % 10/19/21 1350  Vitals shown include unvalidated device data.  Last Pain:  Vitals:   10/19/21 0953  TempSrc: Axillary  PainSc: 4       Patients Stated Pain Goal: 2 (59/97/74 1423)  Complications: No notable events documented.

## 2021-10-19 NOTE — Op Note (Signed)
Laparoscopic Cholecystectomy Procedure Note  Indications: This patient presents with acute cholecystitis after a recent C-section and will undergo laparoscopic cholecystectomy.  Pre-operative Diagnosis: Calculus of gallbladder with acute cholecystitis, without mention of obstruction  Post-operative Diagnosis: Gangrenous cholecystitis  Surgeon: Maia Petties   Assistants: Barkley Boards, PA-C  Anesthesia: General endotracheal anesthesia  ASA Class: 2  Procedure Details  The patient was seen again in the Holding Room. The risks, benefits, complications, treatment options, and expected outcomes were discussed with the patient. The possibilities of reaction to medication, pulmonary aspiration, perforation of viscus, bleeding, recurrent infection, finding a normal gallbladder, the need for additional procedures, failure to diagnose a condition, the possible need to convert to an open procedure, and creating a complication requiring transfusion or operation were discussed with the patient. The likelihood of improving the patient's symptoms with return to their baseline status is good.  The patient and/or family concurred with the proposed plan, giving informed consent. The site of surgery properly noted. The patient was taken to Operating Room, identified as Connie Randall and the procedure verified as Laparoscopic Cholecystectomy with Intraoperative Cholangiogram. A Time Out was held and the above information confirmed.  Prior to the induction of general anesthesia, antibiotic prophylaxis was administered. General endotracheal anesthesia was then administered and tolerated well. After the induction, the abdomen was prepped with Chloraprep and draped in sterile fashion. The patient was positioned in the supine position.  Local anesthetic agent was injected into the skin below the umbilicus and an incision made. We dissected down to the abdominal fascia with blunt dissection.  The fascia was incised  vertically and we entered the peritoneal cavity bluntly.  A pursestring suture of 0-Vicryl was placed around the fascial opening.  The Hasson cannula was inserted and secured with the stay suture.  Pneumoperitoneum was then created with CO2 and tolerated well without any adverse changes in the patient's vital signs. An 11-mm port was placed in the subxiphoid position.  Two 5-mm ports were placed in the right upper quadrant. All skin incisions were infiltrated with a local anesthetic agent before making the incision and placing the trocars.   The uterus was examined and was moderately enlarged, but no sign of bleeding or inflammation.  There was some free fluid in the pelvis, but no purulence.  The hepatic flexure of the colon is adherent to the abdominal wall with some omentum.  When we dissected this away bluntly, we encountered some dark brown free fluid.  However, the colon seems to be intact.  We positioned the patient in reverse Trendelenburg, tilted slightly to the patient's left.  The gallbladder was identified.  The entire gallbladder appears gangrenous and distended.  We decompressed the gallbladder with the suction aspirator.  The fundus was grasped and retracted cephalad. There are significant inflammatory adhesions to the gallbladder which were bluntly dissected away, taking care not to injure any adjacent organs or viscus. The infundibulum was grasped and retracted laterally, exposing the peritoneum overlying the triangle of Calot. This was then divided and exposed in a blunt fashion. The cystic duct was clearly identified and bluntly dissected circumferentially. A critical view of the cystic duct and cystic artery was obtained.  The cystic duct was then ligated with clips and divided. The cystic artery was, dissected free, ligated with clips and divided as well.   The gallbladder was dissected from the liver bed in retrograde fashion with the electrocautery. The gallbladder was removed and placed  in an Eco sac. The liver  bed was irrigated and inspected. Hemostasis was achieved with the electrocautery and Surgical SNOW. Copious irrigation was utilized and was repeatedly aspirated until clear.  The gallbladder and Eco sac were then removed through the umbilical port site.    We again inspected the right upper quadrant for hemostasis.  A 19 French drain was placed, exiting through the lateral port site.  This was secured with 3-0 Ethilon.  The ports were removed. The pursestring suture was used to close the umbilical fascia.  Pneumoperitoneum was released as we removed the trocars.  4-0 Monocryl was used to close the skin.   Benzoin, steri-strips, and clean dressings were applied. The patient was then extubated and brought to the recovery room in stable condition. Instrument, sponge, and needle counts were correct at closure and at the conclusion of the case.   Findings: Gangrenous cholecystitis  Estimated Blood Loss: less than 100 mL         Drains: none         Specimens: Gallbladder           Complications: None; patient tolerated the procedure well.         Disposition: PACU - hemodynamically stable.         Condition: stable  Connie Randall. Connie Dover, MD, [facility] Surgery  General Surgery   10/19/2021 1:23 PM

## 2021-10-19 NOTE — Interval H&P Note (Signed)
History and Physical Interval Note:  10/19/2021 11:14 AM  Connie Randall  has presented today for surgery, with the diagnosis of Acute Cholecystitis.  The various methods of treatment have been discussed with the patient and family. After consideration of risks, benefits and other options for treatment, the patient has consented to  Procedure(s): LAPAROSCOPIC CHOLECYSTECTOMY WITH INTRAOPERATIVE CHOLANGIOGRAM (N/A) as a surgical intervention.  The patient's history has been reviewed, patient examined, no change in status, stable for surgery.  I have reviewed the patient's chart and labs.  Questions were answered to the patient's satisfaction.     Maia Petties

## 2021-10-19 NOTE — Progress Notes (Signed)
Patient ID: Connie Randall, female   DOB: [birth year], 50 y.o.   MRN: [id number] Chart check and update  I confirmed with OR that pt scheduled for laparoscopic cholecystectomy with intraoperative cholangiogram with General Surgery at 1140am today  I reviewed preoperative prep with nurse on shift to inform upcoming team at shift change ( to include periop flowsheet check off). Per OR, they will come up for pt when ready for surgery.  Pt ok for liquid diet till midnight then NPO.  I also discussed expected post op care with [facility] : if pt to stay for additional post op care, given social factors as well, to consider admission to antepartum floor for mother/baby bonding vs med-surg floor. Stated will plan per day census.     I was notified of BP 144/93  ( 125-144/73-93) at 0050am today  Pt had received procardia 30xl in am yesterday and again at 18:24pm for BP mgmt  I asked for an additional 30xl to be given x 1 now. Otherwise to continue on 30xl BID  Continue to monitor BPs q 4hrs

## 2021-10-19 NOTE — Discharge Instructions (Signed)
[facility] LAPAROSCOPIC SURGERY: POST OP INSTRUCTIONS Always review your discharge instruction sheet given to you by the facility where your surgery was performed. IF YOU HAVE DISABILITY OR FAMILY LEAVE FORMS, YOU MUST BRING THEM TO THE OFFICE FOR PROCESSING.   DO NOT GIVE THEM TO YOUR DOCTOR.  PAIN CONTROL  First take acetaminophen (Tylenol) AND/or ibuprofen (Advil) to control your pain after surgery.  Follow directions on package.  Taking acetaminophen (Tylenol) and/or ibuprofen (Advil) regularly after surgery will help to control your pain and lower the amount of prescription pain medication you may need.  You should not take more than 3,000 mg (3 grams) of acetaminophen (Tylenol) in 24 hours.  You should not take ibuprofen (Advil), aleve, motrin, naprosyn or other NSAIDS if you have a history of stomach ulcers or chronic kidney disease.  A prescription for pain medication may be given to you upon discharge.  Take your pain medication as prescribed, if you still have uncontrolled pain after taking acetaminophen (Tylenol) or ibuprofen (Advil). Use ice packs to help control pain. If you need a refill on your pain medication, please contact your pharmacy.  They will contact our office to request authorization. Prescriptions will not be filled after 5pm or on week-ends.  HOME MEDICATIONS Take your usually prescribed medications unless otherwise directed.  DIET You should follow a light diet the first few days after arrival home.  Be sure to include lots of fluids daily. Avoid fatty, fried foods.   CONSTIPATION It is common to experience some constipation after surgery and if you are taking pain medication.  Increasing fluid intake and taking a stool softener (such as Colace) will usually help or prevent this problem from occurring.  A mild laxative (Milk of Magnesia or Miralax) should be taken according to package instructions if there are no bowel movements after 48  hours.  WOUND/INCISION CARE Most patients will experience some swelling and bruising in the area of the incisions.  Ice packs will help.  Swelling and bruising can take several days to resolve.  Unless discharge instructions indicate otherwise, follow guidelines below  STERI-STRIPS - you may remove your outer bandages 48 hours after surgery, and you may shower at that time.  You have steri-strips (small skin tapes) in place directly over the incision.  These strips should be left on the skin for 7-10 days.   DERMABOND/SKIN GLUE - you may shower in 24 hours.  The glue will flake off over the next 2-3 weeks. Any sutures or staples will be removed at the office during your follow-up visit.  ACTIVITIES You may resume regular (light) daily activities beginning the next day--such as daily self-care, walking, climbing stairs--gradually increasing activities as tolerated.  You may have sexual intercourse when it is comfortable.  Refrain from any heavy lifting or straining until approved by your doctor. You may drive when you are no longer taking prescription pain medication, you can comfortably wear a seatbelt, and you can safely maneuver your car and apply brakes.  FOLLOW-UP You should see your doctor in the office for a follow-up appointment approximately 2-3 weeks after your surgery.  You should have been given your post-op/follow-up appointment when your surgery was scheduled.  If you did not receive a post-op/follow-up appointment, make sure that you call for this appointment within a day or two after you arrive home to insure a convenient appointment time.   WHEN TO CALL YOUR DOCTOR: Fever over 101.0 Inability to urinate Continued bleeding from incision.   Increased pain, redness, or drainage from the incision. Increasing abdominal pain  The clinic staff is available to answer your questions during regular business hours.  Please don't hesitate to call and ask to speak to one of the nurses for  clinical concerns.  If you have a medical emergency, go to the nearest emergency room or call 911.  A surgeon from [facility] is always on call at the hospital. [address], [address], [place], [place]  [address] ? [address] [address], [place], [place]   [address] [phone] ? [phone] ? FAX [phone] Web site: [email or web address]  

## 2021-10-19 NOTE — Anesthesia Preprocedure Evaluation (Addendum)
Anesthesia Evaluation  Patient identified by MRN, date of birth, ID band Patient awake    Reviewed: Allergy & Precautions, NPO status , Patient's Chart, lab work & pertinent test results  History of Anesthesia Complications (+) PONV  Airway Mallampati: III  TM Distance: >3 FB Neck ROM: Full    Dental no notable dental hx. (+) Teeth Intact, Dental Advisory Given   Pulmonary neg pulmonary ROS,    Pulmonary exam normal breath sounds clear to auscultation       Cardiovascular negative cardio ROS Normal cardiovascular exam Rhythm:Regular Rate:Normal     Neuro/Psych PSYCHIATRIC DISORDERS Anxiety negative neurological ROS     GI/Hepatic negative GI ROS, Neg liver ROS,   Endo/Other  Hypothyroidism   Renal/GU negative Renal ROS  negative genitourinary   Musculoskeletal negative musculoskeletal ROS (+)   Abdominal   Peds  Hematology negative hematology ROS (+)   Anesthesia Other Findings   Reproductive/Obstetrics                            Anesthesia Physical Anesthesia Plan  ASA: 3  Anesthesia Plan: General   Post-op Pain Management:    Induction: Intravenous  PONV Risk Score and Plan: 4 or greater and Midazolam, Dexamethasone, Ondansetron, TIVA, Scopolamine patch - Pre-op and Aprepitant  Airway Management Planned: Oral ETT and Video Laryngoscope Planned  Additional Equipment:   Intra-op Plan:   Post-operative Plan: Extubation in OR  Informed Consent: I have reviewed the patients History and Physical, chart, labs and discussed the procedure including the risks, benefits and alternatives for the proposed anesthesia with the patient or authorized representative who has indicated his/her understanding and acceptance.     Dental advisory given  Plan Discussed with: CRNA  Anesthesia Plan Comments:        Anesthesia Quick Evaluation

## 2021-10-19 NOTE — Progress Notes (Signed)
Subjective: Postpartum Day 9: Cesarean Delivery POD#0 s/p L/S cholecystectomy  Pain controlled, denies nausea  Objective: Vital signs in last 24 hours: Temp:  [97.8 F (36.6 C)-98.9 F (37.2 C)] 98.3 F (36.8 C) (11/17 1430) Pulse Rate:  [93-102] 98 (11/17 1430) Resp:  [16-25] 20 (11/17 1430) BP: (125-146)/(73-93) 143/88 (11/17 1430) SpO2:  [92 %-98 %] 93 % (11/17 1430) Vitals:   10/19/21 0953 10/19/21 1345 10/19/21 1400 10/19/21 1430  BP: (!) 146/86 140/84 (!) 141/80 (!) 143/88  Pulse: 100 93  98  Resp: 18 (!) 25  20  Temp: 97.8 F (36.6 C) 98.9 F (37.2 C)  98.3 F (36.8 C)  TempSrc: Axillary   Oral  SpO2:  98% 92% 93%  Weight:      Height:         Physical Exam:  General: alert, cooperative, and appears stated age 50: appropriate Uterine Fundus: firm Incision: cesarean incision C/D/I, Cholecystectomy incisions dressed DVT Evaluation: No evidence of DVT seen on physical exam.  Recent Labs    10/17/21 0718 10/18/21 0759  HGB 11.4* 12.4  HCT 33.5* 37.0    Assessment/Plan: Status post Cesarean section. Doing well postoperatively.  Continue current care. Bps mild range on procardia 30XL Continued post-op care per gen surgery. Pt should be ready for discharge once deemed appropriate by general surgery  Vanessa Kick 10/19/2021, 5:23 PM

## 2021-10-19 NOTE — Progress Notes (Signed)
Pt BP was 144/93, per orders called Dr.Banga and said to continue with 4 hour vitals and continue to monitor.

## 2021-10-19 NOTE — Anesthesia Procedure Notes (Signed)
Procedure Name: Intubation Date/Time: 10/19/2021 12:05 PM Performed by: Janene Harvey, CRNA Pre-anesthesia Checklist: Patient identified, Emergency Drugs available, Suction available and Patient being monitored Patient Re-evaluated:Patient Re-evaluated prior to induction Oxygen Delivery Method: Circle system utilized Preoxygenation: Pre-oxygenation with 100% oxygen Induction Type: IV induction and Rapid sequence Laryngoscope Size: Glidescope and 3 Grade View: Grade I Tube type: Oral Tube size: 7.0 mm Number of attempts: 1 Airway Equipment and Method: Stylet and Oral airway Placement Confirmation: ETT inserted through vocal cords under direct vision, positive ETCO2 and breath sounds checked- equal and bilateral Secured at: 21 cm Tube secured with: Tape Dental Injury: Teeth and Oropharynx as per pre-operative assessment  Comments: Elective glidescope. Pt 1week postpartum with limited mouth opening

## 2021-10-20 ENCOUNTER — Encounter (HOSPITAL_COMMUNITY): Payer: Self-pay | Admitting: Surgery

## 2021-10-20 LAB — CBC
HCT: 34.7 % — ABNORMAL LOW (ref 36.0–46.0)
Hemoglobin: 11.6 g/dL — ABNORMAL LOW (ref 12.0–15.0)
MCH: 29.6 pg (ref 26.0–34.0)
MCHC: 33.4 g/dL (ref 30.0–36.0)
MCV: 88.5 fL (ref 80.0–100.0)
Platelets: 477 10*3/uL — ABNORMAL HIGH (ref 150–400)
RBC: 3.92 MIL/uL (ref 3.87–5.11)
RDW: 13.7 % (ref 11.5–15.5)
WBC: 15.2 10*3/uL — ABNORMAL HIGH (ref 4.0–10.5)
nRBC: 0 % (ref 0.0–0.2)

## 2021-10-20 LAB — COMPREHENSIVE METABOLIC PANEL
ALT: 23 U/L (ref 0–44)
AST: 30 U/L (ref 15–41)
Albumin: 2 g/dL — ABNORMAL LOW (ref 3.5–5.0)
Alkaline Phosphatase: 166 U/L — ABNORMAL HIGH (ref 38–126)
Anion gap: 9 (ref 5–15)
BUN: 5 mg/dL — ABNORMAL LOW (ref 6–20)
CO2: 24 mmol/L (ref 22–32)
Calcium: 7.9 mg/dL — ABNORMAL LOW (ref 8.9–10.3)
Chloride: 104 mmol/L (ref 98–111)
Creatinine, Ser: 0.72 mg/dL (ref 0.44–1.00)
GFR, Estimated: >60 mL/min (ref >60)
Glucose, Bld: 128 mg/dL — ABNORMAL HIGH (ref 70–99)
Potassium: 3.2 mmol/L — ABNORMAL LOW (ref 3.5–5.1)
Sodium: 137 mmol/L (ref 135–145)
Total Bilirubin: 0.6 mg/dL (ref 0.3–1.2)
Total Protein: 6.2 g/dL — ABNORMAL LOW (ref 6.5–8.1)

## 2021-10-20 LAB — SURGICAL PATHOLOGY

## 2021-10-20 MED ORDER — ENOXAPARIN SODIUM 40 MG/0.4ML IJ SOSY
40.0000 mg | PREFILLED_SYRINGE | INTRAMUSCULAR | Status: DC
  Administered 2021-10-20 – 2021-10-21 (×2): 40 mg via SUBCUTANEOUS
  Filled 2021-10-20 (×2): qty 0.4

## 2021-10-20 NOTE — Progress Notes (Signed)
Progress Note  1 Day Post-Op  Subjective: Patient reports some incisional soreness but pain well controlled. She denies nausea or vomiting and is tolerating CLD. She had some diarrhea overnight. She is hopeful that drain can be removed prior to discharge.   Objective: Vital signs in last 24 hours: Temp:  [97.8 F (36.6 C)-98.9 F (37.2 C)] 98.2 F (36.8 C) (11/18 0940) Pulse Rate:  [75-100] 75 (11/18 0940) Resp:  [17-25] 18 (11/18 0536) BP: (131-146)/(80-89) 134/89 (11/18 0940) SpO2:  [92 %-98 %] 98 % (11/18 0940)    Intake/Output from previous day: 11/17 0701 - 11/18 0700 In: 1070 [P.O.:70; I.V.:1000] Out: 210 [Drains:60; Blood:150] Intake/Output this shift: No intake/output data recorded.  PE: General: pleasant, WD, WN female who is laying in bed in NAD HEENT: Sclera are anicteric Heart: regular, rate, and rhythm.   Lungs: Respiratory effort nonlabored Abd: soft, appropriately ttp, ND, +BS, surgical dressings clean and intact, drain with small amount of bloody fluid MS: all 4 extremities are symmetrical with no cyanosis, clubbing, or edema. Skin: warm and dry with no masses, lesions, or rashes Psych: A&Ox3 with an appropriate affect.    Lab Results:  Recent Labs    10/18/21 0759 10/20/21 0508  WBC 17.2* 15.2*  HGB 12.4 11.6*  HCT 37.0 34.7*  PLT 469* 477*   BMET Recent Labs    10/18/21 0759 10/20/21 0508  NA 134* 137  K 3.2* 3.2*  CL 102 104  CO2 22 24  GLUCOSE 78 128*  BUN 8 5*  CREATININE 0.72 0.72  CALCIUM 7.9* 7.9*   PT/INR No results for input(s): LABPROT, INR in the last 72 hours. CMP     Component Value Date/Time   NA 137 10/20/2021 0508   K 3.2 (L) 10/20/2021 0508   CL 104 10/20/2021 0508   CO2 24 10/20/2021 0508   GLUCOSE 128 (H) 10/20/2021 0508   BUN 5 (L) 10/20/2021 0508   CREATININE 0.72 10/20/2021 0508   CALCIUM 7.9 (L) 10/20/2021 0508   PROT 6.2 (L) 10/20/2021 0508   ALBUMIN 2.0 (L) 10/20/2021 0508   AST 30 10/20/2021 0508    ALT 23 10/20/2021 0508   ALKPHOS 166 (H) 10/20/2021 0508   BILITOT 0.6 10/20/2021 0508   GFRNONAA >60 10/20/2021 0508   GFRAA >60 09/12/2016 1555   Lipase     Component Value Date/Time   LIPASE 23 10/16/2021 1023       Studies/Results: NM Hepatobiliary Liver Func  Result Date: 10/18/2021 CLINICAL DATA:  Gallbladder thickening, RIGHT upper quadrant pain. Gallbladder distension. Elevated white blood cell count. EXAM: NUCLEAR MEDICINE HEPATOBILIARY IMAGING TECHNIQUE: Sequential images of the abdomen were obtained out to 60 minutes following intravenous administration of radiopharmaceutical. RADIOPHARMACEUTICALS:  5.3 mCi Tc-20m  Choletec IV COMPARISON:  Ultrasound 10/16/2021,  CT 10/15/2021 FINDINGS: Prompt clearance radiotracer from blood pool and homogeneous uptake in liver. Counts are evident in the small bowel 10 minutes. The gallbladder fails to fill over 90 minutes of imaging. Counts progress into the small bowel. 3 mg IV morphine was administered to augment filling of the gallbladder. No gallbladder filling post morphine injection. IMPRESSION: Non filling of the gallbladder indicates cystic duct obstruction and consistent with acute cholecystitis. These results will be called to the ordering clinician or representative by the Radiologist Assistant, and communication documented in the PACS or [employer]. Electronically Signed   By: Suzy Bouchard M.D.   On: 10/18/2021 16:37    Anti-infectives: Anti-infectives (From admission, onward)  Start     Dose/Rate Route Frequency Ordered Stop   10/18/21 2200  piperacillin-tazobactam (ZOSYN) IVPB 3.375 g       Note to Pharmacy: Pharmacy to check dosing   3.375 g 12.5 mL/hr over 240 Minutes Intravenous Every 8 hours 10/18/21 1720     10/11/21 2330  ceFAZolin (ANCEF) IVPB 2g/100 mL premix        2 g 200 mL/hr over 30 Minutes Intravenous  Once 10/11/21 2233 10/11/21 2330   10/11/21 2330  azithromycin (ZITHROMAX) 500 mg in sodium  chloride 0.9 % 250 mL IVPB        500 mg 250 mL/hr over 60 Minutes Intravenous  Once 10/11/21 2233 10/12/21 0304        Assessment/Plan Gangrenous Cholecystitis  POD1 S/p laparoscopic cholecystectomy by Dr. Georgette Dover  - pt tolerated CLD and having bowel function - ok to advance to reg diet today - drain with low output, bloody drainage - may be able to DC drain prior to discharge if output remains low and does not appear bilious - LFTs and WBC downtrending, recommend continuing IV abx while admitted and then transition to 5 days PO abx on discharge - likely discharge tomorrow AM from a general surgery standpoint if stable from an OB standpoint    FEN: reg diet, SLIV VTE: SCDs ID: Zosyn   S/P cesarean section - per OB Hx of thyroid cancer   LOS: 9 days    Norm Parcel, [facility] 10/20/2021, 9:46 AM Please see Amion for pager number during day hours 7:00am-4:30pm

## 2021-10-20 NOTE — Progress Notes (Signed)
Pt had omelette, potatoes, and fruit for breakfast, tolerating solids well. No nausea or vomiting.

## 2021-10-20 NOTE — Progress Notes (Signed)
Pt has been able to tolerate liquids without any nausea or vomiting.  Eartha Inch RN

## 2021-10-20 NOTE — Progress Notes (Addendum)
Subjective: Postpartum Day 9: Cesarean Delivery Postoperative Day 1: lap cholecystectomy  Connie Randall is doing well this morning. Reports some incisional pain which is controlled with her current regimen. She is tolerating a clear liquid diet without nausea or vomiting. No headache, vision changes, RUQ pain  Objective: Patient Vitals for the past 24 hrs:  BP Temp Temp src Pulse Resp SpO2  10/20/21 0940 134/89 98.2 F (36.8 C) Oral 75 18 98 %  10/20/21 0536 139/84 98.2 F (36.8 C) Oral -- 18 97 %  10/20/21 0250 131/84 -- -- -- -- --  10/20/21 0030 131/89 98.9 F (37.2 C) Oral 97 18 --  10/19/21 1945 134/84 98.2 F (36.8 C) Oral 97 17 96 %  10/19/21 1540 139/87 98.5 F (36.9 C) Oral 95 (!) 21 95 %  10/19/21 1430 (!) 143/88 98.3 F (36.8 C) Oral 98 20 93 %  10/19/21 1400 (!) 141/80 -- -- -- -- 92 %  10/19/21 1345 140/84 98.9 F (37.2 C) -- 93 (!) 25 98 %    Physical Exam:  General: alert, cooperative, and no distress Lochia: appropriate Uterine Fundus: firm Incision: pfannenstiel incision clean/dry/intact, lap incisions clean/dry/intact, drain with small amount of serosanguinous fluid DVT Evaluation: No evidence of DVT seen on physical exam.  Recent Labs    10/18/21 0759 10/20/21 0508  HGB 12.4 11.6*  HCT 37.0 34.7*    Assessment/Plan:  Connie Randall [id number] POD#9 sp primary cesarean at  [redacted]w[redacted]d, POD#1 from lap cholecystectomy 1. PPC: routine postpartum care 2. Postpartum hypertension: normal to mild range on Procardia XL 30, majority <140/90 3. Cholecystitis s/p lap cholecystectomy: appreciate general surgery care, advancing diet today, continue IV zosyn and plan for PO abx (5 days) at discharge, potentially d/c drain tomorrow, likely discharge home tomorrow 4. Dispo: likely d/c home tomorrow pending continued good BP control and clearance by general surgery team.    Rowland Lathe 10/20/2021, 11:01 AM

## 2021-10-21 LAB — COMPREHENSIVE METABOLIC PANEL
ALT: 22 U/L (ref 0–44)
AST: 25 U/L (ref 15–41)
Albumin: 1.9 g/dL — ABNORMAL LOW (ref 3.5–5.0)
Alkaline Phosphatase: 154 U/L — ABNORMAL HIGH (ref 38–126)
Anion gap: 6 (ref 5–15)
BUN: 8 mg/dL (ref 6–20)
CO2: 27 mmol/L (ref 22–32)
Calcium: 7.7 mg/dL — ABNORMAL LOW (ref 8.9–10.3)
Chloride: 104 mmol/L (ref 98–111)
Creatinine, Ser: 0.8 mg/dL (ref 0.44–1.00)
GFR, Estimated: >60 mL/min (ref >60)
Glucose, Bld: 83 mg/dL (ref 70–99)
Potassium: 3 mmol/L — ABNORMAL LOW (ref 3.5–5.1)
Sodium: 137 mmol/L (ref 135–145)
Total Bilirubin: 0.5 mg/dL (ref 0.3–1.2)
Total Protein: 5.8 g/dL — ABNORMAL LOW (ref 6.5–8.1)

## 2021-10-21 LAB — CBC
HCT: 35 % — ABNORMAL LOW (ref 36.0–46.0)
Hemoglobin: 11.8 g/dL — ABNORMAL LOW (ref 12.0–15.0)
MCH: 29.7 pg (ref 26.0–34.0)
MCHC: 33.7 g/dL (ref 30.0–36.0)
MCV: 88.2 fL (ref 80.0–100.0)
Platelets: 490 10*3/uL — ABNORMAL HIGH (ref 150–400)
RBC: 3.97 MIL/uL (ref 3.87–5.11)
RDW: 13.7 % (ref 11.5–15.5)
WBC: 11.5 10*3/uL — ABNORMAL HIGH (ref 4.0–10.5)
nRBC: 0 % (ref 0.0–0.2)

## 2021-10-21 MED ORDER — SODIUM CHLORIDE 0.9 % IV SOLN: 250.0000 mL | INTRAVENOUS | Status: DC | PRN

## 2021-10-21 MED ORDER — AMOXICILLIN-POT CLAVULANATE 875-125 MG PO TABS: 1.0000 | ORAL_TABLET | Freq: Two times a day (BID) | ORAL | 1 refills | Status: DC

## 2021-10-21 MED ORDER — ACETAMINOPHEN 500 MG PO TABS: 1000.0000 mg | ORAL_TABLET | Freq: Four times a day (QID) | ORAL | 0 refills | Status: DC

## 2021-10-21 MED ORDER — CALCIUM POLYCARBOPHIL 625 MG PO TABS
625.0000 mg | ORAL_TABLET | Freq: Two times a day (BID) | ORAL | Status: DC
  Filled 2021-10-21 (×2): qty 1

## 2021-10-21 MED ORDER — NIFEDIPINE ER 30 MG PO TB24: 30.0000 mg | ORAL_TABLET | Freq: Two times a day (BID) | ORAL | 1 refills | Status: DC

## 2021-10-21 MED ORDER — IBUPROFEN 600 MG PO TABS: 600.0000 mg | ORAL_TABLET | Freq: Four times a day (QID) | ORAL | 0 refills | Status: DC

## 2021-10-21 MED ORDER — SODIUM CHLORIDE 0.9% FLUSH: 3.0000 mL | INTRAVENOUS | Status: DC | PRN

## 2021-10-21 MED ORDER — SODIUM CHLORIDE 0.9% FLUSH: 3.0000 mL | Freq: Two times a day (BID) | INTRAVENOUS | Status: DC

## 2021-10-21 NOTE — Progress Notes (Signed)
Connie Randall [id number] [birth year]  CARE TEAM:  PCP: Patient, No Pcp Per (Inactive)  Outpatient Care Team: Patient Care Team: Patient, No Pcp Per (Inactive) as PCP - General (General Practice)  Inpatient Treatment Team: Treatment Team: Attending Provider: Jerelyn Charles, MD; Rounding Team: Edison Pace, Md, MD; Registered Nurse: Irene Limbo, RN; Registered Nurse: Donata Duff, RN; Registered Nurse: Almond Lint, RN; Technician: Roxy Horseman   Problem List:   Active Problems:   Advanced maternal age, primigravida   Cholecystitis, acute   2 Days Post-Op  10/19/2021  Procedure(s): LAPAROSCOPIC CHOLECYSTECTOMY    Assessment  Improving  [facility] Stay = 10 days)  Assessment/Plan Gangrenous Cholecystitis  POD#2 S/p laparoscopic cholecystectomy by Dr. Georgette Dover  - OK to d/c home with close outpatient follow-up - drain with low output, bloody drainage = OK to d/c.  Discussed with RN - LFTs and WBC downtrending, recommend continuing IV abx while admitted and then transition to 5 days PO abx on discharge - I wrote eRx for Augmentin x 5d -Consider fiber bowel regimen -I will defer oral pain medication to primary service.  Looks like hydrocodone is already written for   FEN: reg diet, SLIV VTE: SCDs ID: Zosyn -switch to Augmentin x5 days   S/P cesarean section - per OB Hx of thyroid cancer   LOS: 9 days        20 minutes spent in review, evaluation, examination, counseling, and coordination of care.   I have reviewed this patient's available data, including medical history, events of note, physical examination and test results as part of my evaluation.  A significant portion of that time was spent in counseling.  Care during the described time interval was provided by me.  10/21/2021    Subjective: (Chief complaint)  Feeling much better.  Mom in room with newborn.  Tolerating solid diet.  Nursing just outside room  Objective:  Vital signs:  Vitals:    10/20/21 2130 10/21/21 0130 10/21/21 0255 10/21/21 0708  BP: 125/75 136/84 131/74 139/84  Pulse: 80 72 68 72  Resp: 18 16  16   Temp: 98.4 F (36.9 C) 98.2 F (36.8 C)  98.2 F (36.8 C)  TempSrc: Oral Oral  Oral  SpO2: 98% 98%    Weight:      Height:           Intake/Output   Yesterday:  11/18 0701 - 11/19 0700 In: -  Out: 90 [Drains:90] This shift:  Total I/O In: -  Out: 50 [Drains:50]  Bowel function:  Flatus: YES  BM:  No  Drain: Serosanguinous   Physical Exam:  General: Pt awake/alert in no acute distress Eyes: PERRL, normal EOM.  Sclera clear.  No icterus Neuro: CN II-XII intact w/o focal sensory/motor deficits. Lymph: No head/neck/groin lymphadenopathy Psych:  No delerium/psychosis/paranoia.  Oriented x 4 HENT: Normocephalic, Mucus membranes moist.  No thrush Neck: Supple, No tracheal deviation.  No obvious thyromegaly Chest: No pain to chest wall compression.  Good respiratory excursion.  No audible wheezing CV:  Pulses intact.  Regular rhythm.  No major extremity edema MS: Normal AROM mjr joints.  No obvious deformity  Abdomen: Soft.  Nondistended.  Mildly tender at incisions only.  Laparoscopic sites clear.  Pfannenstiel with Steri-Strips clean with normal healing ridge.  No cellulitis.  No evidence of peritonitis.  No incarcerated hernias.  Ext:   No deformity.  No mjr edema.  No cyanosis Skin: No petechiae / purpurea.  No major sores.  Warm and  dry    Results:   Cultures: Recent Results (from the past 720 hour(s))  SARS Coronavirus 2 (TAT 6-24 hrs)     Status: None   Collection Time: 10/09/21 12:00 AM  Result Value Ref Range Status   SARS Coronavirus 2 RESULT: NEGATIVE  Final    Comment: RESULT: NEGATIVESARS-CoV-2 INTERPRETATION:A NEGATIVE  test result means that SARS-CoV-2 RNA was not present in the specimen above the limit of detection of this test. This does not preclude a possible SARS-CoV-2 infection and should not be used as the  sole  basis for patient management decisions. Negative results must be combined with clinical observations, patient history, and epidemiological information. Optimum specimen types and timing for peak viral levels during infections caused by SARS-CoV-2  have not been determined. Collection of multiple specimens or types of specimens may be necessary to detect virus. Improper specimen collection and handling, sequence variability under primers/probes, or organism present below the limit of detection may  lead to false negative results. Positive and negative predictive values of testing are highly dependent on prevalence. False negative test results are more likely when prevalence of disease is high.The expected result is NEGATIVE.Fact S heet for  Healthcare Providers: LocalChronicle.no Sheet for Patients: SalonLookup.es Reference Range - Negative     Labs: Results for orders placed or performed during the hospital encounter of 10/11/21 (from the past 48 hour(s))  Surgical pathology     Status: None   Collection Time: 10/19/21 11:49 AM  Result Value Ref Range   SURGICAL PATHOLOGY      SURGICAL PATHOLOGY CASE: [id number] PATIENT: Connie Randall Surgical Pathology Report     Clinical History: acute cholecystitis (cm)     FINAL MICROSCOPIC DIAGNOSIS:  A. GALLBLADDER, CHOLECYSTECTOMY: - Gangrenous cholecystitis with adjacent benign liver tissue. - Negative for malignancy.   Asucena Galer DESCRIPTION:  Size/?Intact: An intact gallbladder measuring 8.2 x 6.1 x 1.5 cm Serosal surface: Red-brown, dull, with a small amount of attached red-brown nodular tissue grossly suggestive of liver parenchyma Mucosa/Wall: Mucosa is red-brown and dusky, and the wall measures up to 0.6 cm in thickness Contents: Gallbladder contains a moderate amount of red-brown hemorrhagic bile, with a small amount of brown, softened hemorrhagic material.  No distinct  calculi are grossly identified. Cystic duct: 0.2 cm in diameter Block Summary: 1 block submitted  Craig Staggers 10/19/2021)    Final Diagnosis performed by Betsy Pries, MD.   Electronically signed 10/20/2021 Technica l component performed at [facility]. Connie Randall, [place] [address], [place], [place] [address].  Professional component performed at [facility]. [address] [place], [place] [address]  Immunohistochemistry Technical component (if applicable) was performed at [facility]. [address] [place], [place], [place] [address].  IMMUNOHISTOCHEMISTRY DISCLAIMER (if applicable): Some of these immunohistochemical stains may have been developed and the performance characteristics determine by [facility]. Some may not have been cleared or approved by the U.S. Food and Drug Administration. The FDA has determined that such clearance or approval is not necessary. This test is used for clinical purposes. It should not be regarded as investigational or for research. This laboratory is certified under the [place] (CLIA-88) as qualified to perform high complexity clinical labor atory testing.  The controls stained appropriately.   CBC     Status: Abnormal   Collection Time: 10/20/21  5:08 AM  Result Value Ref Range   WBC 15.2 (H) 4.0 - 10.5 K/uL   RBC 3.92  3.87 - 5.11 MIL/uL   Hemoglobin 11.6 (L) 12.0 - 15.0 g/dL   HCT 34.7 (L) 36.0 - 46.0 %   MCV 88.5 80.0 - 100.0 fL   MCH 29.6 26.0 - 34.0 pg   MCHC 33.4 30.0 - 36.0 g/dL   RDW 13.7 11.5 - 15.5 %   Platelets 477 (H) 150 - 400 K/uL   nRBC 0.0 0.0 - 0.2 %    Comment: Performed at [facility] [address], [place], [place] [address]  Comprehensive metabolic panel     Status: Abnormal   Collection Time: 10/20/21  5:08 AM  Result Value Ref Range   Sodium 137 135 - 145 mmol/L   Potassium 3.2 (L) 3.5 - 5.1 mmol/L    Chloride 104 98 - 111 mmol/L   CO2 24 22 - 32 mmol/L   Glucose, Bld 128 (H) 70 - 99 mg/dL    Comment: Glucose reference range applies only to samples taken after fasting for at least 8 hours.   BUN 5 (L) 6 - 20 mg/dL   Creatinine, Ser 0.72 0.44 - 1.00 mg/dL   Calcium 7.9 (L) 8.9 - 10.3 mg/dL   Total Protein 6.2 (L) 6.5 - 8.1 g/dL   Albumin 2.0 (L) 3.5 - 5.0 g/dL   AST 30 15 - 41 U/L   ALT 23 0 - 44 U/L   Alkaline Phosphatase 166 (H) 38 - 126 U/L   Total Bilirubin 0.6 0.3 - 1.2 mg/dL   GFR, Estimated >60 >60 mL/min    Comment: (NOTE) Calculated using the CKD-EPI Creatinine Equation (2021)    Anion gap 9 5 - 15    Comment: Performed at [facility], [place] [address], [place], [place] [address]  CBC     Status: Abnormal   Collection Time: 10/21/21  4:29 AM  Result Value Ref Range   WBC 11.5 (H) 4.0 - 10.5 K/uL   RBC 3.97 3.87 - 5.11 MIL/uL   Hemoglobin 11.8 (L) 12.0 - 15.0 g/dL   HCT 35.0 (L) 36.0 - 46.0 %   MCV 88.2 80.0 - 100.0 fL   MCH 29.7 26.0 - 34.0 pg   MCHC 33.7 30.0 - 36.0 g/dL   RDW 13.7 11.5 - 15.5 %   Platelets 490 (H) 150 - 400 K/uL   nRBC 0.0 0.0 - 0.2 %    Comment: Performed at [facility], [place] [address], [place], [place] [address]  Comprehensive metabolic panel     Status: Abnormal   Collection Time: 10/21/21  4:29 AM  Result Value Ref Range   Sodium 137 135 - 145 mmol/L   Potassium 3.0 (L) 3.5 - 5.1 mmol/L   Chloride 104 98 - 111 mmol/L   CO2 27 22 - 32 mmol/L   Glucose, Bld 83 70 - 99 mg/dL    Comment: Glucose reference range applies only to samples taken after fasting for at least 8 hours.   BUN 8 6 - 20 mg/dL   Creatinine, Ser 0.80 0.44 - 1.00 mg/dL   Calcium 7.7 (L) 8.9 - 10.3 mg/dL   Total Protein 5.8 (L) 6.5 - 8.1 g/dL   Albumin 1.9 (L) 3.5 - 5.0 g/dL   AST 25 15 - 41 U/L   ALT 22 0 - 44 U/L   Alkaline Phosphatase 154 (H) 38 - 126 U/L   Total Bilirubin 0.5 0.3 - 1.2 mg/dL   GFR, Estimated >60 >60 mL/min    Comment: (NOTE) Calculated  using the CKD-EPI Creatinine Equation (2021)    Anion gap 6 5 - 15    Comment: Performed at [facility], [place] [address], [place], [place] [id number]    Imaging / Studies: No results found.  Medications / Allergies: per chart  Antibiotics: Anti-infectives (From admission, onward)    Start     Dose/Rate Route Frequency Ordered Stop   10/18/21 2200  piperacillin-tazobactam (ZOSYN) IVPB 3.375 g       Note to Pharmacy: Pharmacy to check dosing   3.375 g 12.5 mL/hr over 240 Minutes Intravenous Every 8 hours 10/18/21 1720     10/11/21 2330  ceFAZolin (ANCEF) IVPB 2g/100 mL premix        2 g 200 mL/hr over 30 Minutes Intravenous  Once 10/11/21 2233 10/11/21 2330   10/11/21 2330  azithromycin (ZITHROMAX) 500 mg in sodium chloride 0.9 % 250 mL IVPB        500 mg 250 mL/hr over 60 Minutes Intravenous  Once 10/11/21 2233 10/12/21 0304         Note: Portions of this report may have been transcribed using voice recognition software. Every effort was made to ensure accuracy; however, inadvertent computerized transcription errors may be present.   Any transcriptional errors that result from this process are unintentional.    Adin Hector, MD, FACS, MASCRS Esophageal, Gastrointestinal & Colorectal Surgery Robotic and Minimally Invasive Surgery  [facility], [place]  [place]. [address], [place], [place] [address] [phone] Fax [phone] Main  CONTACT INFORMATION:  Weekday (9AM-5PM): Call [facility] main office at [phone]  Weeknight (5PM-9AM) or Weekend/Holiday: Check [email or web address] (password " [email or web address]") for General Surgery [facility] coverage  (Please, do not use SecureChat as it is not reliable communication to operating surgeons for immediate patient care)      10/21/2021  9:36 AM

## 2021-10-21 NOTE — Progress Notes (Signed)
Subjective: Postpartum Day 10: Cesarean Delivery Postoperative Day 2:  lap cholecystectomy  Patient reports tolerating PO.  She has had no N/V and bowel function good.  Baby eating well.  Pain well-controlled with ibuprofen and tylenol  Objective: Vital signs in last 24 hours: Temp:  [97.5 F (36.4 C)-98.4 F (36.9 C)] 98.2 F (36.8 C) (11/19 0708) Pulse Rate:  [68-91] 72 (11/19 0708) Resp:  [16-18] 16 (11/19 0708) BP: (125-147)/(74-84) 139/84 (11/19 0708) SpO2:  [97 %-98 %] 98 % (11/19 0130)  Physical Exam:  General: alert and cooperative Lochia: appropriate Uterine Fundus: firm Incision: C/D/I   Recent Labs    10/20/21 0508 10/21/21 0429  HGB 11.6* 11.8*  HCT 34.7* 35.0*    Assessment/Plan: Status post Cesarean section. Doing well postoperatively. Improved from acute cholecystitis , per surgery pt clear to remove drain and d/c home Discharge home with standard precautions and return to clinic 10 days for BP check.  BP stable on procardia XL 30mg  Logan Bores 10/21/2021, 10:11 AM

## 2021-10-21 NOTE — Discharge Summary (Signed)
Postpartum Discharge Summary       Patient Name: Connie Randall DOB: [birth year] MRN: [id number]  Date of admission: 10/11/2021 Delivery date:10/11/2021  Delivering provider: Jerelyn Charles  Date of discharge: 10/21/2021  Admitting diagnosis: Advanced maternal age, primigravida [O09.519] Intrauterine pregnancy: [redacted]w[redacted]d     Secondary diagnosis:  Active Problems:   Advanced maternal age, primigravida   Cholecystitis, acute  Additional problems: gestational hypertension    Discharge diagnosis: Term Pregnancy Delivered and Gestational Hypertension                                              Post partum procedures: Laparoscopic cholecystectomy, antibiotics Augmentation: Cytotec Complications: None  Hospital course: Induction of Labor With Cesarean Section   50 y.o. yo [phone] at [redacted]w[redacted]d was admitted to the hospital 10/11/2021 for induction of labor. Patient had a labor course significant for relative lack of progress.. The patient went for cesarean section due to Arrest of Dilation. Delivery details are as follows: Membrane Rupture Time/Date: 6:00 AM ,10/11/2021   Delivery Method:C-Section, Low Vertical  Details of operation can be found in separate operative Note.  Patient had a postpartum course complicated by gestational hypertension and then acute cholecystitis.   Her blood pressure was controlled on Procardia XL 30mg  po daily.  On POD 4-5, she began to have vomiting and acute abdominal pain and distention.  Work up reveled acute cholecystitis and she underwent a laparoscopic cholecystectomy and had a gangrenous gallbladder. She was given zosyn postop x 2 days. She is ambulating, tolerating a regular diet, passing flatus, and urinating well.  Per surgery her drain can be pulled and the RN will do prior to d/c. Patient is discharged home in stable condition on 10/21/21.      Newborn Data: Birth date:10/11/2021  Birth time:11:17 PM  Gender:Female  Living status:Living  Apgars:9 ,9   Weight:3.335 kg                                Magnesium Sulfate received: No BMZ received: No Rhophylac:No   Physical exam  Vitals:   10/20/21 2130 10/21/21 0130 10/21/21 0255 10/21/21 0708  BP: 125/75 136/84 131/74 139/84  Pulse: 80 72 68 72  Resp: 18 16  16   Temp: 98.4 F (36.9 C) 98.2 F (36.8 C)  98.2 F (36.8 C)  TempSrc: Oral Oral  Oral  SpO2: 98% 98%    Weight:      Height:       General: alert and cooperative Lochia: appropriate Uterine Fundus: firm Incision: Dressing is clean, dry, and intact  Labs: Lab Results  Component Value Date   WBC 11.5 (H) 10/21/2021   HGB 11.8 (L) 10/21/2021   HCT 35.0 (L) 10/21/2021   MCV 88.2 10/21/2021   PLT 490 (H) 10/21/2021   CMP Latest Ref Rng & Units 10/21/2021  Glucose 70 - 99 mg/dL 83  BUN 6 - 20 mg/dL 8  Creatinine 0.44 - 1.00 mg/dL 0.80  Sodium 135 - 145 mmol/L 137  Potassium 3.5 - 5.1 mmol/L 3.0(L)  Chloride 98 - 111 mmol/L 104  CO2 22 - 32 mmol/L 27  Calcium 8.9 - 10.3 mg/dL 7.7(L)  Total Protein 6.5 - 8.1 g/dL 5.8(L)  Total Bilirubin 0.3 - 1.2 mg/dL 0.5  Alkaline Phos 38 - 126 U/L  154(H)  AST 15 - 41 U/L 25  ALT 0 - 44 U/L 22   Edinburgh Score: Edinburgh Postnatal Depression Scale Screening Tool 10/14/2021  I have been able to laugh and see the funny side of things. 1  I have looked forward with enjoyment to things. 1  I have blamed myself unnecessarily when things went wrong. 1  I have been anxious or worried for no good reason. 1  I have felt scared or panicky for no good reason. 0  Things have been getting on top of me. 1  I have been so unhappy that I have had difficulty sleeping. 1  I have felt sad or miserable. 1  I have been so unhappy that I have been crying. 1  The thought of harming myself has occurred to me. 0  Edinburgh Postnatal Depression Scale Total 8     After visit meds:  Allergies as of 10/21/2021       Reactions   No Known Allergies         Medication List     TAKE  these medications    acetaminophen 500 MG tablet Commonly known as: TYLENOL Take 2 tablets (1,000 mg total) by mouth every 6 (six) hours.   amoxicillin-clavulanate 875-125 MG tablet Commonly known as: Augmentin Take 1 tablet by mouth 2 (two) times daily.   ferrous sulfate 325 (65 FE) MG tablet Take 325 mg by mouth daily with breakfast.   HYDROcodone-acetaminophen 7.5-325 MG tablet Commonly known as: Norco Take 1 tablet by mouth every 6 (six) hours as needed for moderate pain.   ibuprofen 600 MG tablet Commonly known as: ADVIL Take 1 tablet (600 mg total) by mouth every 6 (six) hours.   levothyroxine 100 MCG tablet Commonly known as: Synthroid Take 1 tablet (100 mcg total) by mouth daily.   NIFEdipine 30 MG 24 hr tablet Commonly known as: ADALAT CC Take 1 tablet (30 mg total) by mouth 2 (two) times daily.   OVER THE COUNTER MEDICATION Take 2 tablets by mouth daily. LIVCO (Schisandra, Rosemary and Milk Thistle combination)   promethazine 25 MG suppository Commonly known as: PHENERGAN Place 1 suppository (25 mg total) rectally every 6 (six) hours as needed for nausea or vomiting.         Discharge home in stable condition Infant Feeding: Bottle Infant Disposition:home with mother Discharge instruction: per After Visit Summary and Postpartum booklet. Activity: Advance as tolerated. Pelvic rest for 6 weeks.  Diet:  low fat diet Future Appointments:No future appointments. Follow up Visit:  Follow-up Information     Surgery, [place]. Go on 11/09/2021.   Specialty: General Surgery Why: 3:00 PM. Please arrive 30 min prior to appointment time. Bring photo ID and any insurance information with you. Contact information: [address] [place] [id number] [phone]         Jerelyn Charles, MD. Schedule an appointment as soon as possible for a visit in 1 week(s).   Specialty: Obstetrics Why: Blood pressure and incision check Contact  information: [place] [address] [phone]                  Please schedule this patient for a In person postpartum visit in 1 week with the following provider: MD.  Delivery mode:  C-Section, Low Vertical  Anticipated Birth Control:   none IVF pregnancy with donor egg at 50 y/o   10/21/2021 Logan Bores, MD

## 2021-10-21 NOTE — Anesthesia Postprocedure Evaluation (Signed)
Anesthesia Post Note  Patient: Connie Randall  Procedure(s) Performed: LAPAROSCOPIC CHOLECYSTECTOMY (Abdomen)     Patient location during evaluation: PACU Anesthesia Type: General Level of consciousness: awake and alert Pain management: pain level controlled Vital Signs Assessment: post-procedure vital signs reviewed and stable Respiratory status: spontaneous breathing, nonlabored ventilation and respiratory function stable Cardiovascular status: blood pressure returned to baseline and stable Postop Assessment: no apparent nausea or vomiting Anesthetic complications: no   No notable events documented.  Last Vitals:  Vitals:   10/21/21 0130 10/21/21 0255  BP: 136/84 131/74  Pulse: 72 68  Resp: 16   Temp: 36.8 C   SpO2: 98%     Last Pain:  Vitals:   10/21/21 0130  TempSrc: Oral  PainSc:                  Lynda Rainwater

## 2021-10-24 ENCOUNTER — Telehealth (HOSPITAL_COMMUNITY): Payer: Self-pay | Admitting: *Deleted

## 2021-10-24 NOTE — Telephone Encounter (Addendum)
Mom reports feeling good. C/s Incision healing well per mom. No concerns about herself at this time. EPDS= 4 [facility] score= 8) Tearful talking about her husband who passed earlier this year, but happy about her baby. Mom reports baby is doing well. Feeding, peeing, and pooping without difficulty. Safe sleep reviewed. Mom reports no concerns about baby at present.  Odis Hollingshead, RN 10-24-2021 at 3:04pm

## 2022-03-02 ENCOUNTER — Encounter: Payer: Self-pay | Admitting: Family Medicine

## 2022-03-02 ENCOUNTER — Ambulatory Visit (INDEPENDENT_AMBULATORY_CARE_PROVIDER_SITE_OTHER): Payer: BC Managed Care – PPO | Admitting: Family Medicine

## 2022-03-02 ENCOUNTER — Encounter: Payer: Self-pay | Admitting: *Deleted

## 2022-03-02 VITALS — BP 132/90 | HR 67 | Temp 97.9°F | Ht 64.0 in | Wt 191.4 lb

## 2022-03-02 DIAGNOSIS — E890 Postprocedural hypothyroidism: Secondary | ICD-10-CM | POA: Diagnosis not present

## 2022-03-02 DIAGNOSIS — I10 Essential (primary) hypertension: Secondary | ICD-10-CM | POA: Insufficient documentation

## 2022-03-02 DIAGNOSIS — C73 Malignant neoplasm of thyroid gland: Secondary | ICD-10-CM | POA: Diagnosis not present

## 2022-03-02 MED ORDER — NIFEDIPINE ER OSMOTIC RELEASE 60 MG PO TB24: 60.0000 mg | ORAL_TABLET | Freq: Every day | ORAL | 1 refills | Status: DC

## 2022-03-02 NOTE — Patient Instructions (Signed)
Welcome to  Family Practice at [facility]! It was a pleasure meeting you today.  As discussed, Please schedule a 6 month follow up visit today.  PLEASE NOTE:  If you had any LAB tests please let us know if you have not heard back within a few days. You may see your results on MyChart before we have a chance to review them but we will give you a call once they are reviewed by us. If we ordered any REFERRALS today, please let us know if you have not heard from their office within the next week.  Let us know through MyChart if you are needing REFILLS, or have your pharmacy send us the request. You can also use MyChart to communicate with me or any office staff.  Please try these tips to maintain a healthy lifestyle:  Eat most of your calories during the day when you are active. Eliminate processed foods including packaged sweets (pies, cakes, cookies), reduce intake of potatoes, white bread, white pasta, and white rice. Look for whole grain options, oat flour or almond flour.  Each meal should contain half fruits/vegetables, one quarter protein, and one quarter carbs (no bigger than a computer mouse).  Cut down on sweet beverages. This includes juice, soda, and sweet tea. Also watch fruit intake, though this is a healthier sweet option, it still contains natural sugar! Limit to 3 servings daily.  Drink at least 1 glass of water with each meal and aim for at least 8 glasses per day  Exercise at least 150 minutes every week.   

## 2022-03-02 NOTE — Progress Notes (Signed)
? ?New Patient Office Visit ? ?Subjective:  ?Patient ID: Connie Randall, female    DOB: 11/04/1971  Age: 51 y.o. MRN: [id number] ? ?CC:  ?Chief Complaint  ?Patient presents with  ? Establish Care  ? Hypertension  ? ? ?HPI ?Connie Randall presents for new pt - HTN ?1.  HTN-after delivery in Nov. on nifedipine 1/d '30mg'$ .  Not checking.  No ha.  Sl tingle in lips. No cp/palp/edema/cough/sob. Joined [employer]. ?2.  Thyroid Ca-s/p thyroidectomy 2017.  On levothyroxine l25-followed by endocrinology. [facility] ?3.  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 ?Past Medical History:  ?Diagnosis Date  ? Anemia   ? Anxiety   ? situational anxiety only no treatment  ? PONV (postoperative nausea and vomiting)   ? Thyroid carcinoma ([place])   ? Thyroid mass   ? ? ?Past Surgical History:  ?Procedure Laterality Date  ? CESAREAN SECTION N/A 10/11/2021  ? Procedure: CESAREAN SECTION;  Surgeon: Jerelyn Charles, MD;  Location: [facility];  Service: Obstetrics;  Laterality: N/A;  ? CHOLECYSTECTOMY N/A 10/19/2021  ? Procedure: LAPAROSCOPIC CHOLECYSTECTOMY;  Surgeon: Donnie Mesa, MD;  Location: [facility];  Service: General;  Laterality: N/A;  ? DIAGNOSTIC LAPAROSCOPY    ? DILATION AND CURETTAGE OF UTERUS    ? DILATION AND EVACUATION  10/31/2011  ? Procedure: DILATATION AND EVACUATION (D&E);  Surgeon: Marcial Pacas;  Location: [facility];  Service: Gynecology;;  ? egg retrieval    ? THYROIDECTOMY  08/03/2016  ? THYROIDECTOMY Left 08/03/2016  ? Procedure: THYROIDECTOMY;  Surgeon: Izora Gala, MD;  Location: [facility];  Service: ENT;  Laterality: Left;  Left thyroid lobectomy with frozen section  ? THYROIDECTOMY  09/19/2016  ? THYROIDECTOMY N/A 09/19/2016  ? Procedure: COMPLETION THYROIDECTOMY;  Surgeon: Izora Gala, MD;  Location: [facility];  Service: ENT;  Laterality: N/A;  ? ? ?Family History  ?Problem Relation Age of Onset  ? Hypertension Mother   ? Hypercholesterolemia Mother   ? Cancer Mother   ? Hypertension Father    ? Hypercholesterolemia Father   ? ? ?Social History  ? ?Socioeconomic History  ? Marital status: Widowed  ?  Spouse name: Not on file  ? Number of children: 1  ? Years of education: Not on file  ? Highest education level: Not on file  ?Occupational History  ? Occupation: Armed forces operational officer  ?Tobacco Use  ? Smoking status: Never  ? Smokeless tobacco: Never  ?Vaping Use  ? Vaping Use: Never used  ?Substance and Sexual Activity  ? Alcohol use: Not Currently  ?  Comment: rare  ? Drug use: No  ? Sexual activity: Yes  ?  Birth control/protection: None  ?Other Topics Concern  ? Not on file  ?Social History Narrative  ? W 03/01/2021  ?   ? 1 son Olen Cordial.  ?   ?   ? Owns businesses  ? ?Social Determinants of Health  ? ?Financial Resource Strain: Not on file  ?Food Insecurity: Not on file  ?Transportation Needs: Not on file  ?Physical Activity: Not on file  ?Stress: Not on file  ?Social Connections: Not on file  ?Intimate Partner Violence: Not on file  ? ? ?ROS  ?ROS: ?Gen: no fever, chills  ?Skin: no rash, itching ?ENT: no ear pain, ear drainage, nasal congestion, rhinorrhea, sinus pressure, sore throat ?Eyes: no blurry vision, double vision ?Resp: no cough, wheeze,SOB ?CV: no CP, palpitations, LE edema,  ?  GI: no heartburn, n/v/d/c, abd pain ?GU: no dysuria, urgency, frequency, hematuria ?MSK: no joint pain, myalgias, back pain ?Neuro: no dizziness, headache, weakness, vertigo ?Psych: no depression, anxiety, insomnia, SI  ? ?Objective:  ? ?Today's Vitals: BP 132/90   Pulse 67   Temp 97.9 ?F (36.6 ?C) (Temporal)   Ht '5\' 4"'$  (1.626 m)   Wt 191 lb 6 oz (86.8 kg)   LMP 02/05/2022 (Approximate)   SpO2 98%   Breastfeeding No   BMI 32.85 kg/m?  ? ?Physical Exam  ?Gen: WDWN NAD OAAF ?HEENT: NCAT, conjunctiva not injected, sclera nonicteric ?TM WNL B, OP moist, no exudates  ?NECK:  supple, no thyromegaly, no nodes, no carotid bruits ?CARDIAC: RRR, S1S2+, no murmur. DP 2+B ?LUNGS: CTAB. No wheezes ?ABDOMEN:  BS+, soft, NTND, No HSM,  no masses ?EXT:  no edema ?MSK: no gross abnormalities.  ?NEURO: A&O x3.  CN II-XII intact.  ?PSYCH: normal mood. Good eye contact  ? ?Assessment & Plan:  ? ?Problem List Items Addressed This Visit   ? ?  ? Cardiovascular and Mediastinum  ? Primary hypertension - Primary  ? Relevant Medications  ? NIFEdipine (PROCARDIA XL/NIFEDICAL XL) 60 MG 24 hr tablet  ?  ? Endocrine  ? Post-surgical hypothyroidism  ? Relevant Medications  ? levothyroxine (SYNTHROID) 125 MCG tablet  ? Thyroid cancer ([place])  ? Relevant Medications  ? levothyroxine (SYNTHROID) 125 MCG tablet  ? HTN-chronic. not ideal and pt has some symptoms-will inc to '60mg'$  nifedipine-work on TLC, check bp's at least weekly.  Hopefully, will be able to come off meds..  return fasting for labs ?Post surg hypothyoidism-(thyroid ca).  Chronic. Stable.  Sees endo-next appt in May.  Meds adjusted during pregnancy.  Has't checked since.  Will order ?Infertility-pap 1 yr ago.  Has 19 mo old son.  Still w/frozen embryo.  Followed MFM ? ?Outpatient Encounter Medications as of 03/02/2022  ?Medication Sig  ? levothyroxine (SYNTHROID) 125 MCG tablet   ? NIFEdipine (PROCARDIA XL/NIFEDICAL XL) 60 MG 24 hr tablet Take 1 tablet (60 mg total) by mouth daily.  ? [DISCONTINUED] NIFEdipine (ADALAT CC) 30 MG 24 hr tablet Take 1 tablet (30 mg total) by mouth 2 (two) times daily.  ? [DISCONTINUED] acetaminophen (TYLENOL) 500 MG tablet Take 2 tablets (1,000 mg total) by mouth every 6 (six) hours.  ? [DISCONTINUED] amoxicillin-clavulanate (AUGMENTIN) 875-125 MG tablet Take 1 tablet by mouth 2 (two) times daily.  ? [DISCONTINUED] ferrous sulfate 325 (65 FE) MG tablet Take 325 mg by mouth daily with breakfast.  ? [DISCONTINUED] HYDROcodone-acetaminophen (NORCO) 7.5-325 MG tablet Take 1 tablet by mouth every 6 (six) hours as needed for moderate pain.  ? [DISCONTINUED] ibuprofen (ADVIL) 600 MG tablet Take 1 tablet (600 mg total) by mouth every 6 (six) hours.  ? [DISCONTINUED] levothyroxine  (SYNTHROID) 100 MCG tablet Take 1 tablet (100 mcg total) by mouth daily.  ? [DISCONTINUED] OVER THE COUNTER MEDICATION Take 2 tablets by mouth daily. LIVCO (Schisandra, Rosemary and Milk Thistle combination)  ? [DISCONTINUED] promethazine (PHENERGAN) 25 MG suppository Place 1 suppository (25 mg total) rectally every 6 (six) hours as needed for nausea or vomiting.  ? ?No facility-administered encounter medications on file as of 03/02/2022.  ? ? ?Follow-up: Return for labs soon fasting.  f/u me 6 mo-HTN.  ? ?Wellington Hampshire, MD ?

## 2022-03-07 ENCOUNTER — Other Ambulatory Visit (INDEPENDENT_AMBULATORY_CARE_PROVIDER_SITE_OTHER): Payer: BC Managed Care – PPO

## 2022-03-07 DIAGNOSIS — I10 Essential (primary) hypertension: Secondary | ICD-10-CM

## 2022-03-07 DIAGNOSIS — E890 Postprocedural hypothyroidism: Secondary | ICD-10-CM

## 2022-03-07 LAB — CBC WITH DIFFERENTIAL/PLATELET
Basophils Absolute: 0.1 10*3/uL (ref 0.0–0.1)
Basophils Relative: 1.1 % (ref 0.0–3.0)
Eosinophils Absolute: 0.1 10*3/uL (ref 0.0–0.7)
Eosinophils Relative: 1.1 % (ref 0.0–5.0)
HCT: 41.9 % (ref 36.0–46.0)
Hemoglobin: 14 g/dL (ref 12.0–15.0)
Lymphocytes Relative: 32.2 % (ref 12.0–46.0)
Lymphs Abs: 1.8 10*3/uL (ref 0.7–4.0)
MCHC: 33.4 g/dL (ref 30.0–36.0)
MCV: 86.5 fl (ref 78.0–100.0)
Monocytes Absolute: 0.3 10*3/uL (ref 0.1–1.0)
Monocytes Relative: 4.9 % (ref 3.0–12.0)
Neutro Abs: 3.4 10*3/uL (ref 1.4–7.7)
Neutrophils Relative %: 60.7 % (ref 43.0–77.0)
Platelets: 308 10*3/uL (ref 150.0–400.0)
RBC: 4.84 Mil/uL (ref 3.87–5.11)
RDW: 15 % (ref 11.5–15.5)
WBC: 5.6 10*3/uL (ref 4.0–10.5)

## 2022-03-07 LAB — COMPREHENSIVE METABOLIC PANEL
ALT: 11 U/L (ref 0–35)
AST: 15 U/L (ref 0–37)
Albumin: 4.3 g/dL (ref 3.5–5.2)
Alkaline Phosphatase: 103 U/L (ref 39–117)
BUN: 10 mg/dL (ref 6–23)
CO2: 25 mEq/L (ref 19–32)
Calcium: 9 mg/dL (ref 8.4–10.5)
Chloride: 102 mEq/L (ref 96–112)
Creatinine, Ser: 1.01 mg/dL (ref 0.40–1.20)
GFR: 65.05 mL/min (ref >60.00)
Glucose, Bld: 96 mg/dL (ref 70–99)
Potassium: 4.3 mEq/L (ref 3.5–5.1)
Sodium: 135 mEq/L (ref 135–145)
Total Bilirubin: 0.6 mg/dL (ref 0.2–1.2)
Total Protein: 7.5 g/dL (ref 6.0–8.3)

## 2022-03-07 LAB — TSH: TSH: 14.53 u[IU]/mL — ABNORMAL HIGH (ref 0.35–5.50)

## 2022-03-07 LAB — T3, FREE: T3, Free: 2.9 pg/mL (ref 2.3–4.2)

## 2022-03-07 LAB — LIPID PANEL
Cholesterol: 296 mg/dL — ABNORMAL HIGH (ref 0–200)
HDL: 55.3 mg/dL (ref >39.00)
LDL Cholesterol: 224 mg/dL — ABNORMAL HIGH (ref 0–99)
NonHDL: 240.48
Total CHOL/HDL Ratio: 5
Triglycerides: 84 mg/dL (ref 0.0–149.0)
VLDL: 16.8 mg/dL (ref 0.0–40.0)

## 2022-03-07 LAB — T4, FREE: Free T4: 0.84 ng/dL (ref 0.60–1.60)

## 2022-03-08 ENCOUNTER — Other Ambulatory Visit: Payer: Self-pay | Admitting: *Deleted

## 2022-03-08 ENCOUNTER — Encounter: Payer: Self-pay | Admitting: Family Medicine

## 2022-03-08 DIAGNOSIS — E7800 Pure hypercholesterolemia, unspecified: Secondary | ICD-10-CM

## 2022-03-08 MED ORDER — ROSUVASTATIN CALCIUM 10 MG PO TABS: 10.0000 mg | ORAL_TABLET | Freq: Every day | ORAL | 1 refills | Status: DC

## 2022-04-02 DIAGNOSIS — E890 Postprocedural hypothyroidism: Secondary | ICD-10-CM | POA: Diagnosis not present

## 2022-04-02 DIAGNOSIS — C73 Malignant neoplasm of thyroid gland: Secondary | ICD-10-CM | POA: Diagnosis not present

## 2022-04-14 ENCOUNTER — Encounter: Payer: Self-pay | Admitting: Family Medicine

## 2022-04-17 MED ORDER — ATOVAQUONE-PROGUANIL HCL 250-100 MG PO TABS: 1.0000 | ORAL_TABLET | Freq: Every day | ORAL | 0 refills | Status: DC

## 2022-04-17 NOTE — Addendum Note (Signed)
Addended by: Wellington Hampshire on: 04/17/2022 05:09 PM ? ? Modules accepted: Orders ? ?

## 2022-06-28 IMAGING — CT CT ABDOMEN LIMITED W/O CM
2 of 4 series · 14 of 46 positions shown, 16 images · non-contrast
Comparison: None.
COMPARISON: None.

Addendum:
CLINICAL DATA: Abdominal distension.

EXAM:
CT ABDOMEN WITHOUT CONTRAST LIMITED
TECHNIQUE: Multidetector CT imaging of the abdomen was performed following the
standard protocol without IV contrast.

[Series 3: ap without · axial · non-contrast · 0.66mm/px · z∈[+1045,+1310]mm · 11 of 61 slices shown, 13 images]
[im 4/61  soft-tissue]
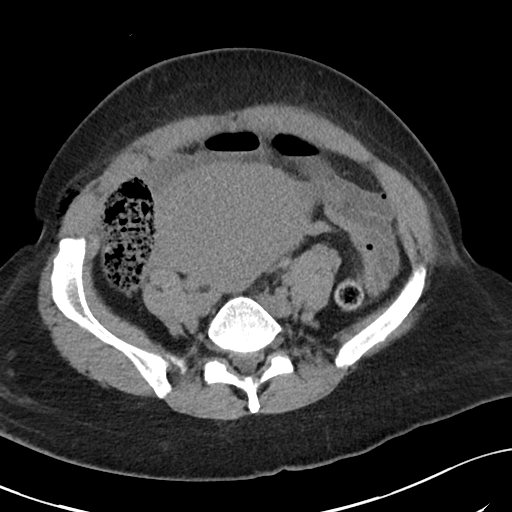
[im 4/61  bone]
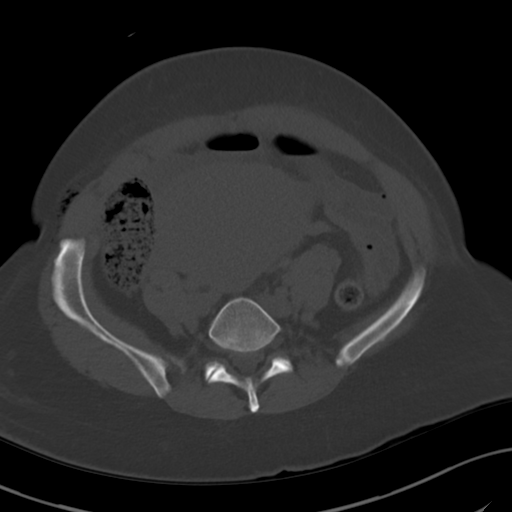
[im 12/61  soft-tissue]
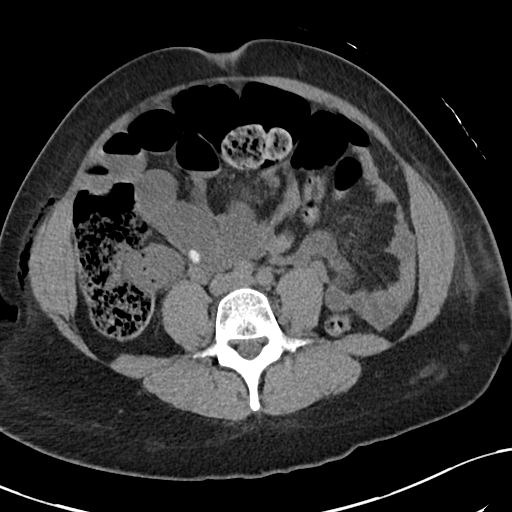
[im 16/61  soft-tissue]
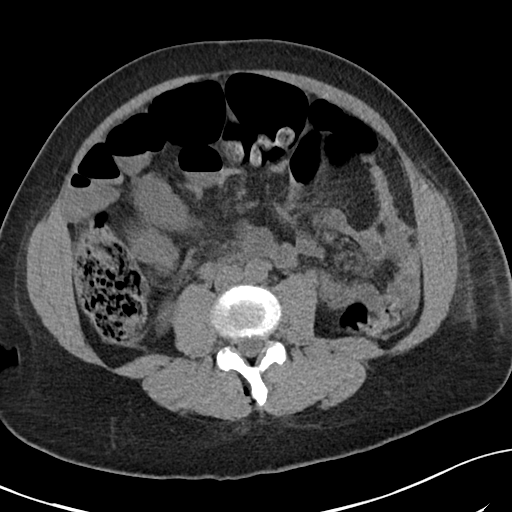
[im 19/61  soft-tissue]
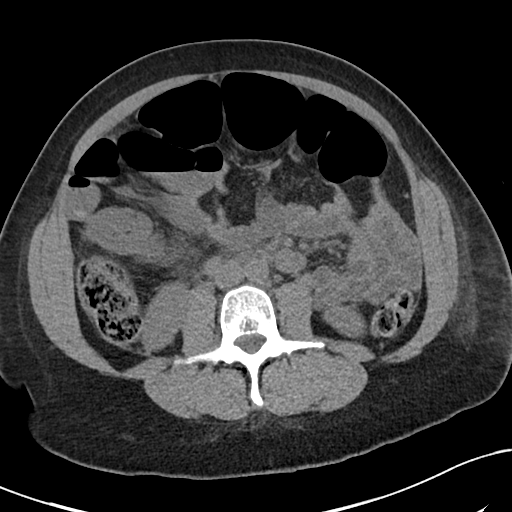
[im 27/61  soft-tissue]
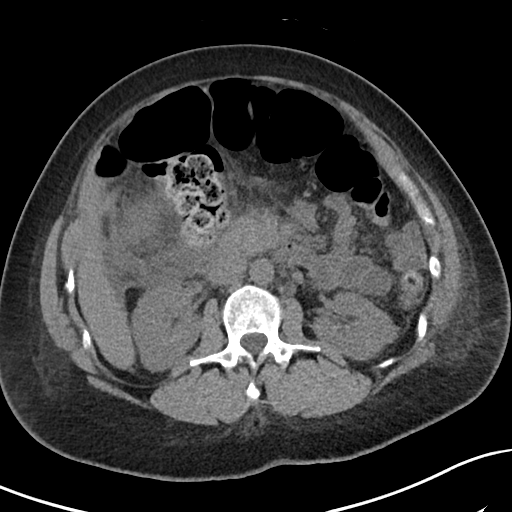
[im 31/61  soft-tissue]
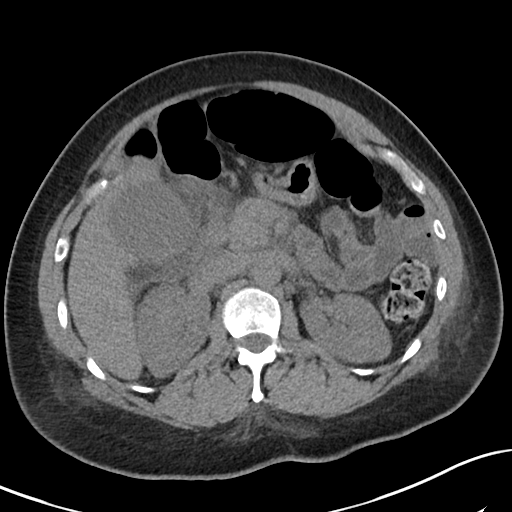
[im 34/61  soft-tissue]
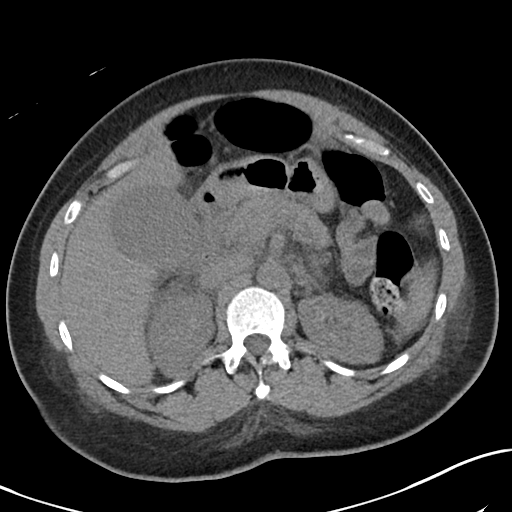
[im 42/61  soft-tissue]
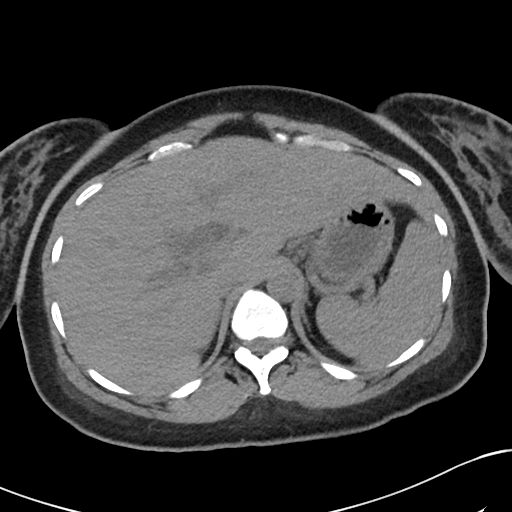
[im 46/61  soft-tissue]
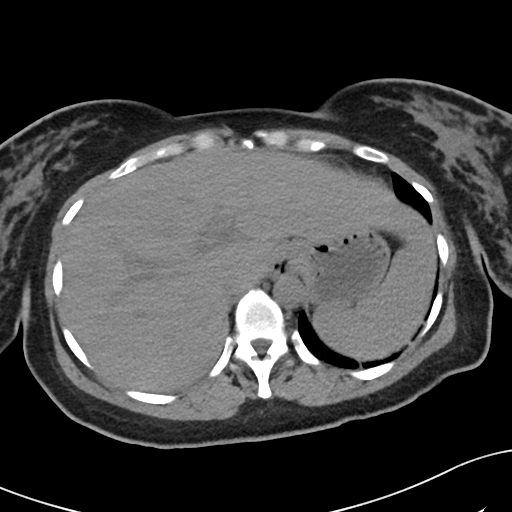
[im 46/61  bone]
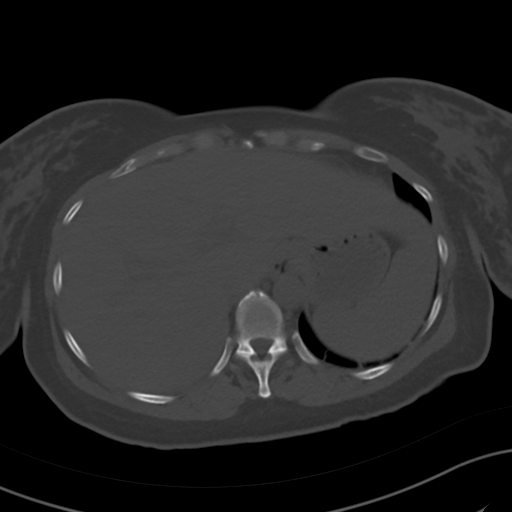
[im 49/61  soft-tissue]
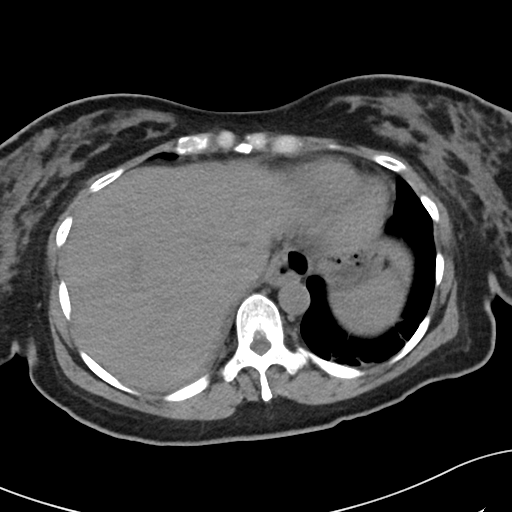
[im 57/61  soft-tissue]
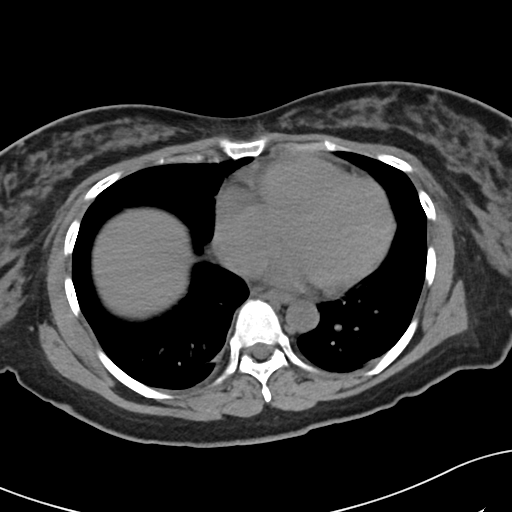

[Series 6: cor · coronal · 0.61mm/px · 3 of 99 slices shown]
[im 33/99  soft-tissue]
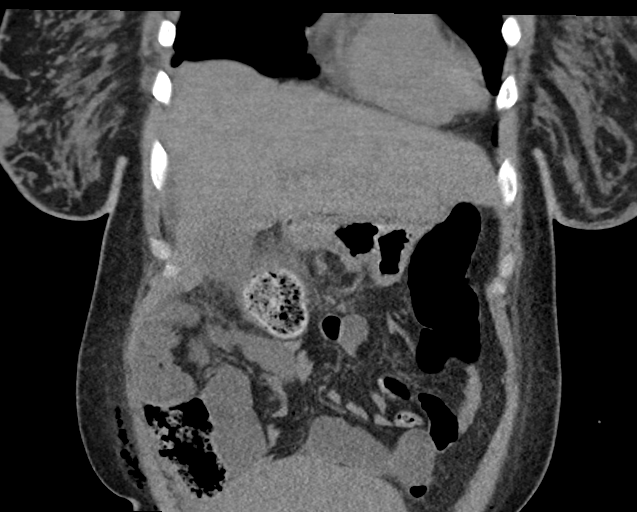
[im 44/99  soft-tissue]
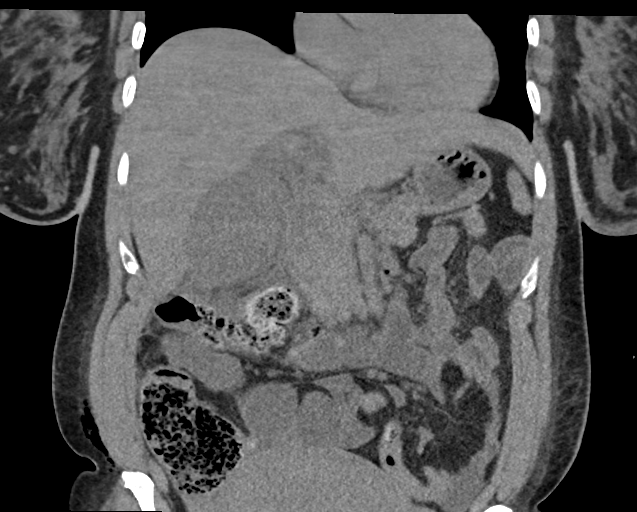
[im 55/99  soft-tissue]
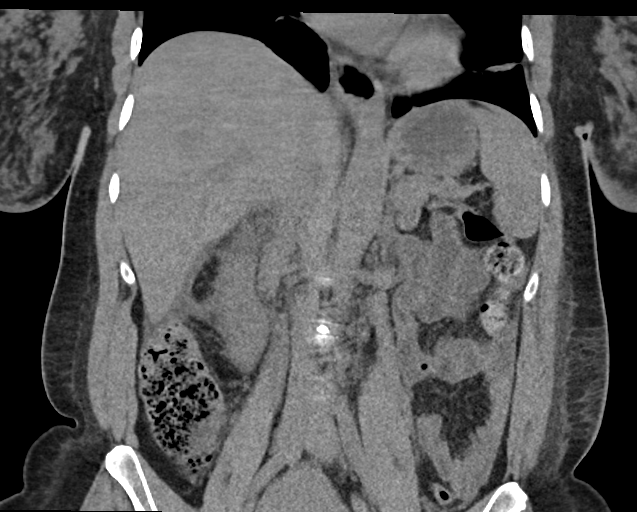

[14 of 46 positions shown; findings below may reference images not displayed]

FINDINGS: Lower chest: Strandy opacities are noted at the lung bases. There is
a trace right pleural effusion. The heart is normal in size.

Hepatobiliary: No focal liver abnormality. The gallbladder is
distended with pericholecystic fat stranding. Periportal edema is
present. The biliary ducts are not well seen due to inflammatory
changes at the porta hepatis.

Pancreas: Mild fat stranding is seen in the region of the pancreatic
head. No pancreatic ductal dilatation is identified.

Spleen: Normal in size without focal abnormality.

Adrenals/Urinary Tract: Adrenal glands are unremarkable. Kidneys are
normal, without renal calculi, focal lesion, or hydronephrosis.

Stomach/Bowel: There is a small hiatal hernia. There are mildly
distended loops of small bowel in the abdomen measuring up to 3.1 cm
in diameter with scattered air-fluid levels. No free air or
pneumatosis is seen. A few scattered air-fluid levels are noted in
the small bowel the appendix is not visualized on exam.

Vascular/Lymphatic: No significant vascular findings are present. No
enlarged abdominal or pelvic lymph nodes.

Other: A trace amount of free fluid is noted in the right upper
quadrant. The uterus appears enlarged.

Musculoskeletal: No acute osseous abnormality. Subcutaneous air is
noted in the low anterior abdominal wall bilaterally.
IMPRESSION: 1. Slightly distended loops of small bowel in the abdomen with
scattered air-fluid levels, possible ileus versus early or partial
small bowel obstruction.
2. Distended gallbladder with surrounding inflammatory changes and
periportal edema. Ultrasound is recommended for further evaluation.
3. Mild fat stranding adjacent to the pancreatic head which may be
related to local inflammatory changes. Correlation with amylase and
lipase is suggested to exclude superimposed pancreatitis.
4. Small amount of free fluid in the right upper quadrant.
5. Subcutaneous air in the lower abdomen, may be iatrogenic.

ADDENDUM:
Critical findings were reported to Dr. Inesis Nalivaika at [DATE] a.m.

*** End of Addendum ***
FINDINGS: Lower chest: Strandy opacities are noted at the lung bases. There is
a trace right pleural effusion. The heart is normal in size.

Hepatobiliary: No focal liver abnormality. The gallbladder is
distended with pericholecystic fat stranding. Periportal edema is
present. The biliary ducts are not well seen due to inflammatory
changes at the porta hepatis.

Pancreas: Mild fat stranding is seen in the region of the pancreatic
head. No pancreatic ductal dilatation is identified.

Spleen: Normal in size without focal abnormality.

Adrenals/Urinary Tract: Adrenal glands are unremarkable. Kidneys are
normal, without renal calculi, focal lesion, or hydronephrosis.

Stomach/Bowel: There is a small hiatal hernia. There are mildly
distended loops of small bowel in the abdomen measuring up to 3.1 cm
in diameter with scattered air-fluid levels. No free air or
pneumatosis is seen. A few scattered air-fluid levels are noted in
the small bowel the appendix is not visualized on exam.

Vascular/Lymphatic: No significant vascular findings are present. No
enlarged abdominal or pelvic lymph nodes.

Other: A trace amount of free fluid is noted in the right upper
quadrant. The uterus appears enlarged.

Musculoskeletal: No acute osseous abnormality. Subcutaneous air is
noted in the low anterior abdominal wall bilaterally.
IMPRESSION: 1. Slightly distended loops of small bowel in the abdomen with
scattered air-fluid levels, possible ileus versus early or partial
small bowel obstruction.
2. Distended gallbladder with surrounding inflammatory changes and
periportal edema. Ultrasound is recommended for further evaluation.
3. Mild fat stranding adjacent to the pancreatic head which may be
related to local inflammatory changes. Correlation with amylase and
lipase is suggested to exclude superimposed pancreatitis.
4. Small amount of free fluid in the right upper quadrant.
5. Subcutaneous air in the lower abdomen, may be iatrogenic.

## 2022-08-22 ENCOUNTER — Encounter: Payer: Self-pay | Admitting: Family Medicine

## 2022-08-22 ENCOUNTER — Ambulatory Visit (INDEPENDENT_AMBULATORY_CARE_PROVIDER_SITE_OTHER): Payer: BC Managed Care – PPO | Admitting: Family Medicine

## 2022-08-22 VITALS — BP 122/78 | HR 73 | Temp 98.5°F | Ht 64.0 in | Wt 198.5 lb

## 2022-08-22 DIAGNOSIS — Z6834 Body mass index (BMI) 34.0-34.9, adult: Secondary | ICD-10-CM

## 2022-08-22 DIAGNOSIS — E890 Postprocedural hypothyroidism: Secondary | ICD-10-CM | POA: Diagnosis not present

## 2022-08-22 DIAGNOSIS — E7801 Familial hypercholesterolemia: Secondary | ICD-10-CM

## 2022-08-22 DIAGNOSIS — E6609 Other obesity due to excess calories: Secondary | ICD-10-CM | POA: Diagnosis not present

## 2022-08-22 DIAGNOSIS — I10 Essential (primary) hypertension: Secondary | ICD-10-CM | POA: Diagnosis not present

## 2022-08-22 MED ORDER — PHENTERMINE HCL 37.5 MG PO CAPS: 37.5000 mg | ORAL_CAPSULE | ORAL | 0 refills | Status: DC

## 2022-08-22 MED ORDER — NIFEDIPINE ER OSMOTIC RELEASE 60 MG PO TB24: 60.0000 mg | ORAL_TABLET | Freq: Every day | ORAL | 1 refills | Status: DC

## 2022-08-22 NOTE — Progress Notes (Signed)
Subjective:     Patient ID: Connie Randall, female    DOB: Aug 07, 1971, 51 y.o.   MRN: [id number]  Chief Complaint  Patient presents with   Follow-up    6 month follow-up Not fasting     HPI  HTN-Pt is on Nifedipine '60mg'$  .  Bp's running  120-140/70-90.  No ha/dizziness/cp/palp/edema/cough/sob. Some exercise.  Working on diet.  Thyroid ca papillary-followed by [facility] HLD-LDL 224.  Hasn't started crestor.   Contemplating pregnancy in 4-5 mo.  Obesity-working some on exercise and diet.  Not having much success.    Health Maintenance Due  Topic Date Due   Hepatitis C Screening  Never done   COLONOSCOPY (Pts 45-65yr Insurance coverage will need to be confirmed)  Never done   PAP SMEAR-Modifier  06/21/2019   MAMMOGRAM  12/20/2021    Past Medical History:  Diagnosis Date   Anemia    Anxiety    situational anxiety only no treatment   PONV (postoperative nausea and vomiting)    [place]roid carcinoma [place]    Thyroid mass     Past Surgical History:  Procedure Laterality Date   CESAREAN SECTION N/A 10/11/2021   Procedure: CESAREAN SECTION;  Surgeon: CJerelyn Charles MD;  [facility];  Service: Obstetrics;  Laterality: N/A;   CHOLECYSTECTOMY N/A 10/19/2021   Procedure: LAPAROSCOPIC CHOLECYSTECTOMY;  Surgeon: TDonnie Mesa MD;  [place]  Service: General;  Laterality: N/A;   DIAGNOSTIC LAPAROSCOPY     DILATION AND CURETTAGE OF UTERUS     DILATION AND EVACUATION  10/31/2011   Procedure: DILATATION AND EVACUATION (D&E);  Surgeon: KMarcial Pacas  [facility];  Service: Gynecology;;   egg retrieval     THYROIDECTOMY  08/03/2016   THYROIDECTOMY Left 08/03/2016   Procedure: THYROIDECTOMY;  Surgeon: JIzora Gala MD;  [place]  Service: ENT;  Laterality: Left;  Left thyroid lobectomy with frozen section   THYROIDECTOMY  09/19/2016   THYROIDECTOMY N/A 09/19/2016   Procedure: COMPLETION THYROIDECTOMY;  Surgeon: JIzora Gala MD;  [place] [facility]  Service: ENT;   Laterality: N/A;    Outpatient Medications Prior to Visit  Medication Sig Dispense Refill   levothyroxine (SYNTHROID) 125 MCG tablet      rosuvastatin (CRESTOR) 10 MG tablet Take 1 tablet (10 mg total) by mouth daily. 90 tablet 1   NIFEdipine (PROCARDIA XL/NIFEDICAL XL) 60 MG 24 hr tablet Take 1 tablet (60 mg total) by mouth daily. 90 tablet 1   atovaquone-proguanil (MALARONE) 250-100 MG TABS tablet Take 1 tablet by mouth daily. 23 tablet 0   No facility-administered medications prior to visit.    Allergies  Allergen Reactions   Molds & Smuts     Other reaction(s): post-nasal drip Other reaction(s): Other (See Comments) Other reaction(s): post-nasal drip   ROS neg/noncontributory except as noted HPI/below      Objective:     BP 122/78   Pulse 73   Temp 98.5 F (36.9 C) (Temporal)   Ht '5\' 4"'$  (1.626 m)   Wt 198 lb 8 oz (90 kg)   SpO2 98%   BMI 34.07 kg/m  Wt Readings from Last 3 Encounters:  08/22/22 198 lb 8 oz (90 kg)  03/02/22 191 lb 6 oz (86.8 kg)  10/11/21 205 lb 6.4 oz (93.2 kg)    Physical Exam   Gen: WDWN NAD HEENT: NCAT, conjunctiva not injected, sclera nonicteric NECK:  supple, no thyromegaly, no nodes, no carotid bruits CARDIAC: RRR, S1S2+, no murmur.  DP 2+B LUNGS: CTAB. No wheezes ABDOMEN:  BS+, soft, NTND, No HSM, no masses EXT:  no edema MSK: no gross abnormalities.  NEURO: A&O x3.  CN II-XII intact.  PSYCH: normal mood. Good eye contact     Assessment & Plan:   Problem List Items Addressed This Visit       Cardiovascular and Mediastinum   Primary hypertension - Primary   Relevant Medications   NIFEdipine (PROCARDIA XL/NIFEDICAL XL) 60 MG 24 hr tablet     Endocrine   Post-surgical hypothyroidism     Other   Familial hypercholesterolemia   Relevant Medications   NIFEdipine (PROCARDIA XL/NIFEDICAL XL) 60 MG 24 hr tablet   Other Visit Diagnoses     Class 1 obesity due to excess calories with serious comorbidity and body mass index  (BMI) of 34.0 to 34.9 in adult       Relevant Medications   phentermine 37.5 MG capsule     1.  Hypertension-started as postpartum.  Still is ongoing.  May be chronic.  Well-controlled on Procardia XL 60 mg.  Renewed.  Patient states she is a hard stick for blood so would prefer not to do any labs today. 2.  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  Controlled.  Managed by [facility] endocrinology.  Continue same dose of Synthroid. 3.  Hyperlipidemia-LDL is 224.  Suspect familial hyperlipidemia.  Patient has not started Crestor.  Discussed guidelines, risk.  She is contemplating pregnancy in about 5 or 6 months.  Advised that she should not be on medicine at that time.  She will decide if she is going to take the meds and get off before she gets pregnant.  She will follow-up with endocrinology as well. 4.  Obesity-patient is trying to work on diet/exercise.  She is still struggling staying on track.  She requested Wegovy.  I have some concerns even though she has papillary thyroid cancer.  I would like her to clear it by allergist.  Also, discussed that if she is thinking of getting pregnant in about 5 months, she will not have reached a peak dose.  She will check with her insurance and possibly endocrinology.  We will try phentermine (PDMP checked) 37.5 mg daily.  Side effects discussed.  Follow-up in 1 month  Meds ordered this encounter  Medications   NIFEdipine (PROCARDIA XL/NIFEDICAL XL) 60 MG 24 hr tablet    Sig: Take 1 tablet (60 mg total) by mouth daily.    Dispense:  90 tablet    Refill:  1   phentermine 37.5 MG capsule    Sig: Take 1 capsule (37.5 mg total) by mouth every morning.    Dispense:  30 capsule    Refill:  0    Wellington Hampshire, MD

## 2022-08-22 NOTE — Patient Instructions (Addendum)
Check if wegovy covered.

## 2022-08-31 DIAGNOSIS — N978 Female infertility of other origin: Secondary | ICD-10-CM | POA: Diagnosis not present

## 2022-09-11 DIAGNOSIS — Z3141 Encounter for fertility testing: Secondary | ICD-10-CM | POA: Diagnosis not present

## 2022-09-16 ENCOUNTER — Other Ambulatory Visit: Payer: Self-pay | Admitting: Family Medicine

## 2022-10-22 DIAGNOSIS — C73 Malignant neoplasm of thyroid gland: Secondary | ICD-10-CM | POA: Diagnosis not present

## 2022-10-22 DIAGNOSIS — E890 Postprocedural hypothyroidism: Secondary | ICD-10-CM | POA: Diagnosis not present

## 2022-10-24 DIAGNOSIS — C73 Malignant neoplasm of thyroid gland: Secondary | ICD-10-CM | POA: Diagnosis not present

## 2022-10-24 DIAGNOSIS — E890 Postprocedural hypothyroidism: Secondary | ICD-10-CM | POA: Diagnosis not present

## 2022-12-19 ENCOUNTER — Ambulatory Visit: Payer: BC Managed Care – PPO | Admitting: Dermatology

## 2023-02-22 DIAGNOSIS — Z131 Encounter for screening for diabetes mellitus: Secondary | ICD-10-CM | POA: Diagnosis not present

## 2023-02-22 DIAGNOSIS — Z79899 Other long term (current) drug therapy: Secondary | ICD-10-CM | POA: Diagnosis not present

## 2023-02-22 DIAGNOSIS — N978 Female infertility of other origin: Secondary | ICD-10-CM | POA: Diagnosis not present

## 2023-02-22 DIAGNOSIS — Z113 Encounter for screening for infections with a predominantly sexual mode of transmission: Secondary | ICD-10-CM | POA: Diagnosis not present

## 2023-02-22 LAB — HM HEPATITIS C SCREENING LAB: HM Hepatitis Screen: NEGATIVE

## 2023-02-27 DIAGNOSIS — Z1231 Encounter for screening mammogram for malignant neoplasm of breast: Secondary | ICD-10-CM | POA: Diagnosis not present

## 2023-03-07 DIAGNOSIS — N978 Female infertility of other origin: Secondary | ICD-10-CM | POA: Diagnosis not present

## 2023-03-07 DIAGNOSIS — N912 Amenorrhea, unspecified: Secondary | ICD-10-CM | POA: Diagnosis not present

## 2023-03-14 ENCOUNTER — Other Ambulatory Visit: Payer: Self-pay | Admitting: Family Medicine

## 2023-03-14 NOTE — Telephone Encounter (Signed)
Needs appt

## 2023-03-22 DIAGNOSIS — N978 Female infertility of other origin: Secondary | ICD-10-CM | POA: Diagnosis not present

## 2023-03-26 ENCOUNTER — Encounter: Payer: Self-pay | Admitting: Family Medicine

## 2023-03-26 ENCOUNTER — Ambulatory Visit (INDEPENDENT_AMBULATORY_CARE_PROVIDER_SITE_OTHER): Payer: BC Managed Care – PPO | Admitting: Family Medicine

## 2023-03-26 VITALS — BP 122/70 | HR 79 | Temp 98.5°F | Ht 64.0 in | Wt 204.4 lb

## 2023-03-26 DIAGNOSIS — E7801 Familial hypercholesterolemia: Secondary | ICD-10-CM | POA: Diagnosis not present

## 2023-03-26 DIAGNOSIS — C73 Malignant neoplasm of thyroid gland: Secondary | ICD-10-CM | POA: Diagnosis not present

## 2023-03-26 DIAGNOSIS — I10 Essential (primary) hypertension: Secondary | ICD-10-CM | POA: Diagnosis not present

## 2023-03-26 MED ORDER — NIFEDIPINE ER OSMOTIC RELEASE 60 MG PO TB24: 60.0000 mg | ORAL_TABLET | Freq: Every day | ORAL | 1 refills | Status: DC

## 2023-03-26 NOTE — Assessment & Plan Note (Addendum)
Chronic.  Not on medications as pre and not pregnancy

## 2023-03-26 NOTE — Assessment & Plan Note (Signed)
Chronic.  Seeing endo.  Remission.

## 2023-03-26 NOTE — Assessment & Plan Note (Signed)
Chronic.  Well controlled.  Continue nifedipine 60 mg daily.  Return for CBC,cmp

## 2023-03-26 NOTE — Progress Notes (Signed)
Subjective:     Patient ID: Connie Randall, female    DOB: [birth year], 52 y.o.   MRN: [id number]  Chief Complaint  Patient presents with   Medication Refill    Need blood pressure medication refilled Need blood work done     HPI  HYPERTENSION-Pt is on nifedipine 60.  Bp's running not checking.  No ha/dizziness/cp/palp/edema/cough/sob  Papillary thyroid cancer-followed by [facility].  On synthroid 0.125 5x/week(s) and 250 2x HLD-LDL >200 not on medications as pregnant. Undergoing prep for IVF-embryo xfer on 4/10. +pregnancy on 4/19.  Needs repeat.  Tired, but otherwise no symptoms   Health Maintenance Due  Topic Date Due   Hepatitis C Screening  Never done   COLONOSCOPY (Pts 45-20yrs Insurance coverage will need to be confirmed)  Never done   PAP SMEAR-Modifier  06/21/2019   MAMMOGRAM  12/20/2021    Past Medical History:  Diagnosis Date   Anemia    Anxiety    situational anxiety only no treatment   PONV (postoperative nausea and vomiting)    Thyroid carcinoma    Thyroid mass     Past Surgical History:  Procedure Laterality Date   CESAREAN SECTION N/A 10/11/2021   Procedure: CESAREAN SECTION;  Surgeon: Marlow Baars, MD;  [facility];  Service: Obstetrics;  Laterality: N/A;   CHOLECYSTECTOMY N/A 10/19/2021   Procedure: LAPAROSCOPIC CHOLECYSTECTOMY;  Surgeon: Manus Rudd, MD;  [facility];  Service: General;  Laterality: N/A;   DIAGNOSTIC LAPAROSCOPY     DILATION AND CURETTAGE OF UTERUS     DILATION AND EVACUATION  10/31/2011   Procedure: DILATATION AND EVACUATION (D&E);  Surgeon: Almon Hercules;  [facility];  Service: Gynecology;;   egg retrieval     THYROIDECTOMY  08/03/2016   THYROIDECTOMY Left 08/03/2016   Procedure: THYROIDECTOMY;  Surgeon: Serena Colonel, MD;  Location: [facility];  Service: ENT;  Laterality: Left;  Left thyroid lobectomy with frozen section   THYROIDECTOMY  09/19/2016   THYROIDECTOMY N/A 09/19/2016   Procedure: COMPLETION  THYROIDECTOMY;  Surgeon: Serena Colonel, MD;  [facility];  Service: ENT;  Laterality: N/A;     Current Outpatient Medications:    B-D 3CC LUER-LOK SYR 18GX1-1/2 18G X 1-1/2" 3 ML MISC, USE TO DRAW UP PROGESTERONE, Disp: , Rfl:    estradiol (ESTRACE) 2 MG tablet, Take by mouth., Disp: , Rfl:    levothyroxine (SYNTHROID) 125 MCG tablet, , Disp: , Rfl:    progesterone 50 MG/ML injection, SMARTSIG:Milliliter(s) IM, Disp: , Rfl:    NIFEdipine (PROCARDIA XL/NIFEDICAL XL) 60 MG 24 hr tablet, Take 1 tablet (60 mg total) by mouth daily., Disp: 90 tablet, Rfl: 1  Allergies  Allergen Reactions   Molds & Smuts     Other reaction(s): post-nasal drip Other reaction(s): Other (See Comments) Other reaction(s): post-nasal drip   ROS neg/noncontributory except as noted HPI/below      Objective:     BP 122/70   Pulse 79   Temp 98.5 F (36.9 C) (Temporal)   Ht  (1.626 m)   Wt 204 lb 6 oz (92.7 kg)   SpO2 99%   Breastfeeding No   BMI 35.08 kg/m  Wt Readings from Last 3 Encounters:  03/26/23 204 lb 6 oz (92.7 kg)  08/22/22 198 lb 8 oz (90 kg)  03/02/22 191 lb 6 oz (86.8 kg)    Physical Exam   Gen: WDWN NAD HEENT: NCAT, conjunctiva not injected, sclera nonicteric NECK:  supple, no thyromegaly,  no nodes, no carotid bruits CARDIAC: RRR, S1S2+, no murmur. DP 2+B LUNGS: CTAB. No wheezes ABDOMEN:  BS+, soft, NTND, No HSM, no masses EXT:  no edema MSK: no gross abnormalities.  NEURO: A&O x3.  CN II-XII intact.  PSYCH: normal mood. Good eye contact     Assessment & Plan:  Primary hypertension Assessment & Plan: Chronic.  Well controlled.  Continue nifedipine 60 mg daily.  Return for CBC,cmp  Orders: -     CBC with Differential/Platelet; Future -     Comprehensive metabolic panel; Future  Thyroid cancer Assessment & Plan: Chronic.  Seeing endo.  Remission.    Familial hypercholesterolemia Assessment & Plan: Chronic.  Not on medications as pre and not  pregnancy   Other orders -     NIFEdipine ER Osmotic Release; Take 1 tablet (60 mg total) by mouth daily.  Dispense: 90 tablet; Refill: 1  Pregnancy-seeing [facility] and infertility.  Will get labs from [employer] so delaying my labs today as patient hard stick.   Angelena Sole, MD

## 2023-03-26 NOTE — Patient Instructions (Signed)
It was very nice to see you today!  Keep me informed   PLEASE NOTE:  If you had any lab tests please let us know if you have not heard back within a few days. You may see your results on MyChart before we have a chance to review them but we will give you a call once they are reviewed by [place]. If we ordered any referrals today, please let us know if you have not heard from their office within the next week.   Please try these tips to maintain a healthy lifestyle:  Eat most of your calories during the day when you are active. Eliminate processed foods including packaged sweets (pies, cakes, cookies), reduce intake of potatoes, white bread, white pasta, and white rice. Look for whole grain options, oat flour or almond flour.  Each meal should contain half fruits/vegetables, one quarter protein, and one quarter carbs (no bigger than a computer mouse).  Cut down on sweet beverages. This includes juice, soda, and sweet tea. Also watch fruit intake, though this is a healthier sweet option, it still contains natural sugar! Limit to 3 servings daily.  Drink at least 1 glass of water with each meal and aim for at least 8 glasses per day  Exercise at least 150 minutes every week.

## 2023-03-27 ENCOUNTER — Other Ambulatory Visit (INDEPENDENT_AMBULATORY_CARE_PROVIDER_SITE_OTHER): Payer: BC Managed Care – PPO

## 2023-03-27 DIAGNOSIS — I10 Essential (primary) hypertension: Secondary | ICD-10-CM | POA: Diagnosis not present

## 2023-03-27 LAB — CBC WITH DIFFERENTIAL/PLATELET
Basophils Absolute: 0.1 10*3/uL (ref 0.0–0.1)
Basophils Relative: 1 % (ref 0.0–3.0)
Eosinophils Absolute: 0.7 10*3/uL (ref 0.0–0.7)
Eosinophils Relative: 9.6 % — ABNORMAL HIGH (ref 0.0–5.0)
HCT: 40.8 % (ref 36.0–46.0)
Hemoglobin: 13.5 g/dL (ref 12.0–15.0)
Lymphocytes Relative: 33.9 % (ref 12.0–46.0)
Lymphs Abs: 2.3 10*3/uL (ref 0.7–4.0)
MCHC: 33.2 g/dL (ref 30.0–36.0)
MCV: 87.3 fl (ref 78.0–100.0)
Monocytes Absolute: 0.4 10*3/uL (ref 0.1–1.0)
Monocytes Relative: 6.4 % (ref 3.0–12.0)
Neutro Abs: 3.4 10*3/uL (ref 1.4–7.7)
Neutrophils Relative %: 49.1 % (ref 43.0–77.0)
Platelets: 429 10*3/uL — ABNORMAL HIGH (ref 150.0–400.0)
RBC: 4.67 Mil/uL (ref 3.87–5.11)
RDW: 14.9 % (ref 11.5–15.5)
WBC: 6.9 10*3/uL (ref 4.0–10.5)

## 2023-03-27 LAB — COMPREHENSIVE METABOLIC PANEL
ALT: 13 U/L (ref 0–35)
AST: 16 U/L (ref 0–37)
Albumin: 3.8 g/dL (ref 3.5–5.2)
Alkaline Phosphatase: 69 U/L (ref 39–117)
BUN: 7 mg/dL (ref 6–23)
CO2: 23 mEq/L (ref 19–32)
Calcium: 8.1 mg/dL — ABNORMAL LOW (ref 8.4–10.5)
Chloride: 107 mEq/L (ref 96–112)
Creatinine, Ser: 0.9 mg/dL (ref 0.40–1.20)
GFR: 74.15 mL/min (ref >60.00)
Glucose, Bld: 99 mg/dL (ref 70–99)
Potassium: 3.7 mEq/L (ref 3.5–5.1)
Sodium: 138 mEq/L (ref 135–145)
Total Bilirubin: 0.4 mg/dL (ref 0.2–1.2)
Total Protein: 7.1 g/dL (ref 6.0–8.3)

## 2023-03-29 DIAGNOSIS — N912 Amenorrhea, unspecified: Secondary | ICD-10-CM | POA: Diagnosis not present

## 2023-04-08 DIAGNOSIS — E890 Postprocedural hypothyroidism: Secondary | ICD-10-CM | POA: Diagnosis not present

## 2023-04-08 DIAGNOSIS — R7989 Other specified abnormal findings of blood chemistry: Secondary | ICD-10-CM | POA: Diagnosis not present

## 2023-04-08 DIAGNOSIS — C73 Malignant neoplasm of thyroid gland: Secondary | ICD-10-CM | POA: Diagnosis not present

## 2023-04-09 DIAGNOSIS — R7989 Other specified abnormal findings of blood chemistry: Secondary | ICD-10-CM | POA: Diagnosis not present

## 2023-04-09 DIAGNOSIS — C73 Malignant neoplasm of thyroid gland: Secondary | ICD-10-CM | POA: Diagnosis not present

## 2023-04-09 DIAGNOSIS — E890 Postprocedural hypothyroidism: Secondary | ICD-10-CM | POA: Diagnosis not present

## 2023-05-15 DIAGNOSIS — Z124 Encounter for screening for malignant neoplasm of cervix: Secondary | ICD-10-CM | POA: Diagnosis not present

## 2023-05-15 DIAGNOSIS — Z01419 Encounter for gynecological examination (general) (routine) without abnormal findings: Secondary | ICD-10-CM | POA: Diagnosis not present

## 2023-05-15 DIAGNOSIS — Z6836 Body mass index (BMI) 36.0-36.9, adult: Secondary | ICD-10-CM | POA: Diagnosis not present

## 2023-05-31 DIAGNOSIS — C73 Malignant neoplasm of thyroid gland: Secondary | ICD-10-CM | POA: Diagnosis not present

## 2023-07-01 DIAGNOSIS — Z85850 Personal history of malignant neoplasm of thyroid: Secondary | ICD-10-CM | POA: Diagnosis not present

## 2023-07-01 DIAGNOSIS — C73 Malignant neoplasm of thyroid gland: Secondary | ICD-10-CM | POA: Diagnosis not present

## 2023-07-01 DIAGNOSIS — R599 Enlarged lymph nodes, unspecified: Secondary | ICD-10-CM | POA: Diagnosis not present

## 2023-07-01 DIAGNOSIS — R590 Localized enlarged lymph nodes: Secondary | ICD-10-CM | POA: Diagnosis not present

## 2023-07-09 ENCOUNTER — Encounter: Payer: Self-pay | Admitting: Family Medicine

## 2023-07-22 ENCOUNTER — Other Ambulatory Visit: Payer: Self-pay | Admitting: Family Medicine

## 2023-07-22 ENCOUNTER — Encounter: Payer: Self-pay | Admitting: Family Medicine

## 2023-07-22 ENCOUNTER — Ambulatory Visit: Payer: BC Managed Care – PPO | Admitting: Family Medicine

## 2023-07-22 ENCOUNTER — Encounter: Payer: Self-pay | Admitting: Obstetrics and Gynecology

## 2023-07-22 VITALS — BP 123/75 | HR 85 | Temp 98.0°F | Ht 64.0 in | Wt 209.8 lb

## 2023-07-22 DIAGNOSIS — I10 Essential (primary) hypertension: Secondary | ICD-10-CM

## 2023-07-22 DIAGNOSIS — Z23 Encounter for immunization: Secondary | ICD-10-CM | POA: Diagnosis not present

## 2023-07-22 DIAGNOSIS — Z6836 Body mass index (BMI) 36.0-36.9, adult: Secondary | ICD-10-CM | POA: Diagnosis not present

## 2023-07-22 DIAGNOSIS — C73 Malignant neoplasm of thyroid gland: Secondary | ICD-10-CM

## 2023-07-22 DIAGNOSIS — E7801 Familial hypercholesterolemia: Secondary | ICD-10-CM

## 2023-07-22 DIAGNOSIS — Z1211 Encounter for screening for malignant neoplasm of colon: Secondary | ICD-10-CM

## 2023-07-22 MED ORDER — VALSARTAN 80 MG PO TABS
80.0000 mg | ORAL_TABLET | Freq: Every day | ORAL | 1 refills | Status: DC
Start: 2023-07-22 — End: 2023-09-10

## 2023-07-22 MED ORDER — WEGOVY 0.25 MG/0.5ML ~~LOC~~ SOAJ
0.2500 mg | SUBCUTANEOUS | 0 refills | Status: DC
Start: 2023-07-22 — End: 2023-09-10

## 2023-07-22 NOTE — Assessment & Plan Note (Signed)
Chronic.  Well controlled but nifedipine causes fatigue.  No further pregnancies.  Will do valsartan 80mg  daily.  Monitor bp;s.  F/u 3 wks and will need bmp then

## 2023-07-22 NOTE — Assessment & Plan Note (Signed)
Chronic.  Seeing endo.  Remission. Continue synthroid 0.125mg 

## 2023-07-22 NOTE — Progress Notes (Signed)
Subjective:     Patient ID: Connie Randall, female    DOB: October 27, 1971, 52 y.o.   MRN: [id number]  Chief Complaint  Patient presents with   Discuss BP medication change    HPI  HTN - Pt stopped nifedipine few weeks ago due to fatigue. She states her fatigue improved while off nifedipine. She took a dose yesterday and today. Bp's running 144/90 yesterday, before taking her nifedipine dose yesterday. She also states her Bp was not running normal while on nifedipine. Her Bp today was 123/75, pulse 99. When rechecked, pulse improved to 85. She states she has not paid much attention to her pulse when monitoring her Bp at home, not sure if it has been elevated in the past. No ha/dizziness/cp/palp/edema/cough/sob.  Weight loss - She is interested in weight loss medication, Wegovy. She states she has not been on it yet. She has been staying active, walking  2-3x/week for about 60-75 minutes walking. Willing to pay out of pocket  Hx of thyroid cancer - She is compliant with synthroid 125 mcg. Seeing endo  Cologuard - No fmhx of colon cancer. Has not previously had a colonoscopy.  Pap smear - UTD, last done 1-2 months ago with Dr. Tenny Craw at [facility].   She has stopped estrogen, only did while working on fertility.   Health Maintenance Due  Topic Date Due   Hepatitis C Screening  Never done   Zoster Vaccines- Shingrix (1 of 2) Never done   Colonoscopy  Never done   PAP SMEAR-Modifier  06/21/2019    Past Medical History:  Diagnosis Date   Anemia    Anxiety    situational anxiety only no treatment   PONV (postoperative nausea and vomiting)    Thyroid carcinoma (HCC)    Thyroid mass     Past Surgical History:  Procedure Laterality Date   CESAREAN SECTION N/A 10/11/2021   Procedure: CESAREAN SECTION;  Surgeon: Marlow Baars, MD;  [facility];  Service: Obstetrics;  Laterality: N/A;   CHOLECYSTECTOMY N/A 10/19/2021   Procedure: LAPAROSCOPIC CHOLECYSTECTOMY;  Surgeon:  Manus Rudd, MD;  [facility];  Service: General;  Laterality: N/A;   DIAGNOSTIC LAPAROSCOPY     DILATION AND CURETTAGE OF UTERUS     DILATION AND EVACUATION  10/31/2011   Procedure: DILATATION AND EVACUATION (D&E);  Surgeon: Almon Hercules;  [facility];  Service: Gynecology;;   egg retrieval     THYROIDECTOMY  08/03/2016   THYROIDECTOMY Left 08/03/2016   Procedure: THYROIDECTOMY;  Surgeon: Serena Colonel, MD;  [facility] OR;  Service: ENT;  Laterality: Left;  Left thyroid lobectomy with frozen section   THYROIDECTOMY  09/19/2016   THYROIDECTOMY N/A 09/19/2016   Procedure: COMPLETION THYROIDECTOMY;  Surgeon: Serena Colonel, MD;  [facility];  Service: ENT;  Laterality: N/A;     Current Outpatient Medications:    levothyroxine (SYNTHROID) 125 MCG tablet, Take by mouth., Disp: , Rfl:    Semaglutide-Weight Management (WEGOVY) 0.25 MG/0.5ML SOAJ, Inject 0.25 mg into the skin once a week., Disp: 2 mL, Rfl: 0   valsartan (DIOVAN) 80 MG tablet, Take 1 tablet (80 mg total) by mouth daily., Disp: 30 tablet, Rfl: 1  Allergies  Allergen Reactions   Molds & Smuts     Other reaction(s): post-nasal drip Other reaction(s): Other (See Comments) Other reaction(s): post-nasal drip   ROS neg/noncontributory except as noted HPI/below      Objective:     BP 123/75 (BP  Location: Right Arm, Patient Position: Sitting)   Pulse 85   Temp 98 F (36.7 C) (Temporal)   Ht 5\' 4"  (1.626 m)   Wt 209 lb 12.8 oz (95.2 kg)   SpO2 98%   Breastfeeding Unknown   BMI 36.01 kg/m  Wt Readings from Last 3 Encounters:  07/22/23 209 lb 12.8 oz (95.2 kg)  03/26/23 204 lb 6 oz (92.7 kg)  08/22/22 198 lb 8 oz (90 kg)    Physical Exam   Gen: WDWN NAD HEENT: NCAT, conjunctiva not injected, sclera nonicteric NECK:  supple, no thyromegaly, no nodes, no carotid bruits CARDIAC: RRR, S1S2+, no murmur. DP 2+B LUNGS: CTAB. No wheezes ABDOMEN:  BS+, soft, NTND, No HSM, no masses EXT:  no edema MSK: no  gross abnormalities.  NEURO: A&O x3.  CN II-XII intact.  PSYCH: normal mood. Good eye contact     Assessment & Plan:  Primary hypertension Assessment & Plan: Chronic.  Well controlled but nifedipine causes fatigue.  No further pregnancies.  Will do valsartan 80mg  daily.  Monitor bp;s.  F/u 3 wks and will need bmp then  Orders: -     Valsartan; Take 1 tablet (80 mg total) by mouth daily.  Dispense: 30 tablet; Refill: 1  Thyroid cancer (HCC) Assessment & Plan: Chronic.  Seeing endo.  Remission. Continue synthroid 0.125mg    Class 2 severe obesity due to excess calories with serious comorbidity and body mass index (BMI) of 36.0 to 36.9 in adult [facility]) -     WUJWJX; Inject 0.25 mg into the skin once a week.  Dispense: 2 mL; Refill: 0  Familial hypercholesterolemia Assessment & Plan: Chronic.  Pt wants to work on diet/exercise/taking wegovy to see if will decrease.  Prefers no meds   Screening for colorectal cancer -     Cologuard  Obesity-discussed diet/exercise.  Wants to start [facility] - SED.  Colace 100-300mg  daily and miralax to prevent constipation  Return in about 3 weeks (around 08/12/2023) for HTN.  I,Rachel Rivera,acting as a scribe for Angelena Sole, MD.,have documented all relevant documentation on the behalf of Angelena Sole, MD,as directed by  Angelena Sole, MD while in the presence of Angelena Sole, MD.  I, Angelena Sole, MD, have reviewed all documentation for this visit. The documentation on 07/22/23 for the exam, diagnosis, procedures, and orders are all accurate and complete.    Angelena Sole, MD

## 2023-07-22 NOTE — Addendum Note (Signed)
Addended by: Donzetta Starch on: 07/22/2023 09:22 AM   Modules accepted: Orders

## 2023-07-22 NOTE — Assessment & Plan Note (Signed)
Chronic.  Pt wants to work on diet/exercise/taking wegovy to see if will decrease.  Prefers no meds

## 2023-07-22 NOTE — Patient Instructions (Addendum)
It was very nice to see you today!  Start valsartan Start wegovy.  Use colace 100-300mg  daily and miralax to prevent constipation   PLEASE NOTE:  If you had any lab tests please let us know if you have not heard back within a few days. You may see your results on MyChart before we have a chance to review them but we will give you a call once they are reviewed by [place]. If we ordered any referrals today, please let us know if you have not heard from their office within the next week.   Please try these tips to maintain a healthy lifestyle:  Eat most of your calories during the day when you are active. Eliminate processed foods including packaged sweets (pies, cakes, cookies), reduce intake of potatoes, white bread, white pasta, and white rice. Look for whole grain options, oat flour or almond flour.  Each meal should contain half fruits/vegetables, one quarter protein, and one quarter carbs (no bigger than a computer mouse).  Cut down on sweet beverages. This includes juice, soda, and sweet tea. Also watch fruit intake, though this is a healthier sweet option, it still contains natural sugar! Limit to 3 servings daily.  Drink at least 1 glass of water with each meal and aim for at least 8 glasses per day  Exercise at least 150 minutes every week.

## 2023-07-25 ENCOUNTER — Telehealth: Payer: Self-pay

## 2023-07-25 NOTE — Telephone Encounter (Signed)
Pharmacy Patient Advocate Encounter   Received notification from CoverMyMeds that prior authorization for [facility] 0.25MG /0.5ML auto-injectors is required/requested.   Insurance verification completed.   The patient is insured through [facility] .   Per test claim: PA required; PA submitted to [employer] via CoverMyMeds Key/confirmation #/EOC [id number] Status is pending

## 2023-07-26 ENCOUNTER — Encounter: Payer: Self-pay | Admitting: *Deleted

## 2023-07-26 NOTE — Telephone Encounter (Signed)
Patient notified of message below.

## 2023-07-26 NOTE — Telephone Encounter (Signed)
Please see message below

## 2023-07-26 NOTE — Telephone Encounter (Signed)
Pharmacy Patient Advocate Encounter  Received notification from [facility] that Prior Authorization for [facility] 0.25MG /0.5ML auto-injectors has been DENIED. Please advise how you'd like to proceed. Full denial letter will be uploaded to the media tab. See denial reason below.   PA #/Case ID/Reference #: [id number]   Denial Reason: This health benefit plan does not cover the following services, supplies, drugs or charges: Any treatment or regimen, medical or surgical, for the purpose of reducing or controlling the weight of the member, or for the treatment of obesity, except for surgical treatment of morbid obesity, or as specifically covered by this health benefit plan.

## 2023-08-08 ENCOUNTER — Ambulatory Visit: Payer: BC Managed Care – PPO | Admitting: Family Medicine

## 2023-08-12 DIAGNOSIS — Z1212 Encounter for screening for malignant neoplasm of rectum: Secondary | ICD-10-CM | POA: Diagnosis not present

## 2023-08-12 DIAGNOSIS — Z1211 Encounter for screening for malignant neoplasm of colon: Secondary | ICD-10-CM | POA: Diagnosis not present

## 2023-08-16 LAB — COLOGUARD: COLOGUARD: NEGATIVE

## 2023-09-09 ENCOUNTER — Ambulatory Visit: Payer: BC Managed Care – PPO | Admitting: Family Medicine

## 2023-09-09 DIAGNOSIS — Z85850 Personal history of malignant neoplasm of thyroid: Secondary | ICD-10-CM | POA: Diagnosis not present

## 2023-09-09 DIAGNOSIS — I10 Essential (primary) hypertension: Secondary | ICD-10-CM | POA: Diagnosis not present

## 2023-09-09 DIAGNOSIS — Z1330 Encounter for screening examination for mental health and behavioral disorders, unspecified: Secondary | ICD-10-CM | POA: Diagnosis not present

## 2023-09-09 DIAGNOSIS — Z6837 Body mass index (BMI) 37.0-37.9, adult: Secondary | ICD-10-CM | POA: Diagnosis not present

## 2023-09-09 DIAGNOSIS — E66812 Obesity, class 2: Secondary | ICD-10-CM | POA: Diagnosis not present

## 2023-09-10 ENCOUNTER — Ambulatory Visit: Payer: BC Managed Care – PPO | Admitting: Family Medicine

## 2023-09-10 ENCOUNTER — Other Ambulatory Visit (HOSPITAL_BASED_OUTPATIENT_CLINIC_OR_DEPARTMENT_OTHER): Payer: Self-pay

## 2023-09-10 ENCOUNTER — Encounter: Payer: Self-pay | Admitting: Family Medicine

## 2023-09-10 VITALS — BP 126/78 | HR 64 | Temp 98.3°F | Resp 18 | Ht 64.0 in | Wt 219.2 lb

## 2023-09-10 DIAGNOSIS — E66812 Obesity, class 2: Secondary | ICD-10-CM | POA: Diagnosis not present

## 2023-09-10 DIAGNOSIS — I10 Essential (primary) hypertension: Secondary | ICD-10-CM

## 2023-09-10 DIAGNOSIS — Z6836 Body mass index (BMI) 36.0-36.9, adult: Secondary | ICD-10-CM | POA: Diagnosis not present

## 2023-09-10 LAB — BASIC METABOLIC PANEL
BUN: 9 mg/dL (ref 6–23)
CO2: 25 meq/L (ref 19–32)
Calcium: 8.8 mg/dL (ref 8.4–10.5)
Chloride: 105 meq/L (ref 96–112)
Creatinine, Ser: 0.92 mg/dL (ref 0.40–1.20)
GFR: 71.99 mL/min (ref >60.00)
Glucose, Bld: 91 mg/dL (ref 70–99)
Potassium: 4.1 meq/L (ref 3.5–5.1)
Sodium: 136 meq/L (ref 135–145)

## 2023-09-10 MED ORDER — VALSARTAN 80 MG PO TABS
80.0000 mg | ORAL_TABLET | Freq: Every day | ORAL | 1 refills | Status: DC
Start: 2023-09-10 — End: 2024-04-10

## 2023-09-10 MED ORDER — ZEPBOUND 2.5 MG/0.5ML ~~LOC~~ SOAJ
2.5000 mg | SUBCUTANEOUS | 0 refills | Status: DC
Start: 2023-09-10 — End: 2023-10-01
  Filled 2023-09-10: qty 2, 28d supply, fill #0

## 2023-09-10 NOTE — Assessment & Plan Note (Signed)
Chronic.  Well controlled continue valsartan 80mg  daily.  Monitor bp;s.

## 2023-09-10 NOTE — Patient Instructions (Signed)

## 2023-09-10 NOTE — Progress Notes (Signed)
great

## 2023-09-10 NOTE — Progress Notes (Signed)
Subjective:     Patient ID: Connie Randall, female    DOB: 09/05/1971, 52 y.o.   MRN: [id number]  Chief Complaint  Patient presents with   Medical Management of Chronic Issues    3 week follow-up on htn Not fasting    HPI  HTN - Pt is on valsartan 80 mg daily.  She hasn't been monitoring her BP at home. States she has been feeling overall well. Her fatigue has improved since discontinuing nifedipine. No ha/dizziness/cp/palp/edema/cough/sob.  Weight management - She was unable to get wegovy from her [facility] pharmacy. Per Pt this was due to a shortage and later the pharmacy did not see the insurance rejection letter. She is receptive to starting Zepbound and will be cash-pay. States she has been overeating since her last visit on 8/19, which has contributed to her weight gain.    There are no preventive care reminders to display for this patient.   Past Medical History:  Diagnosis Date   Anemia    Anxiety    situational anxiety only no treatment   PONV (postoperative nausea and vomiting)    Thyroid carcinoma (HCC)    Thyroid mass     Past Surgical History:  Procedure Laterality Date   CESAREAN SECTION N/A 10/11/2021   Procedure: CESAREAN SECTION;  Surgeon: Marlow Baars, MD;  Location: [facility];  Service: Obstetrics;  Laterality: N/A;   CHOLECYSTECTOMY N/A 10/19/2021   Procedure: LAPAROSCOPIC CHOLECYSTECTOMY;  Surgeon: Manus Rudd, MD;  Location: [facility] OR;  Service: General;  Laterality: N/A;   DIAGNOSTIC LAPAROSCOPY     DILATION AND CURETTAGE OF UTERUS     DILATION AND EVACUATION  10/31/2011   Procedure: DILATATION AND EVACUATION (D&E);  Surgeon: Almon Hercules;  Location: [facility] ORS;  Service: Gynecology;;   egg retrieval     THYROIDECTOMY  08/03/2016   THYROIDECTOMY Left 08/03/2016   Procedure: THYROIDECTOMY;  Surgeon: Serena Colonel, MD;  Location: [facility] OR;  Service: ENT;  Laterality: Left;  Left thyroid lobectomy with frozen section   THYROIDECTOMY  09/19/2016    THYROIDECTOMY N/A 09/19/2016   Procedure: COMPLETION THYROIDECTOMY;  Surgeon: Serena Colonel, MD;  Location: [facility] OR;  Service: ENT;  Laterality: N/A;     Current Outpatient Medications:    levothyroxine (SYNTHROID) 125 MCG tablet, Take by mouth., Disp: , Rfl:    tirzepatide (ZEPBOUND) 2.5 MG/0.5ML Pen, Inject 2.5 mg into the skin once a week., Disp: 2 mL, Rfl: 0   valsartan (DIOVAN) 80 MG tablet, Take 1 tablet (80 mg total) by mouth daily., Disp: 90 tablet, Rfl: 1  Allergies  Allergen Reactions   Molds & Smuts     Other reaction(s): post-nasal drip Other reaction(s): Other (See Comments) Other reaction(s): post-nasal drip   ROS neg/noncontributory except as noted HPI/below      Objective:     BP 126/78 (BP Location: Right Arm, Patient Position: Sitting, Cuff Size: Large)   Pulse 64   Temp 98.3 F (36.8 C) (Temporal)   Resp 18   Ht 5\' 4"  (1.626 m)   Wt 219 lb 4 oz (99.5 kg)   LMP 08/26/2023 (Exact Date)   SpO2 99%   Breastfeeding No   BMI 37.63 kg/m  Wt Readings from Last 3 Encounters:  09/10/23 219 lb 4 oz (99.5 kg)  07/22/23 209 lb 12.8 oz (95.2 kg)  03/26/23 204 lb 6 oz (92.7 kg)    Physical Exam   Gen: WDWN NAD HEENT: NCAT, conjunctiva not injected, sclera nonicteric NECK:  supple, no thyromegaly, no nodes, no carotid bruits CARDIAC: RRR, S1S2+, no murmur. DP 2+B LUNGS: CTAB. No wheezes EXT:  no edema MSK: no gross abnormalities.  NEURO: A&O x3.  CN II-XII intact.  PSYCH: normal mood. Good eye contact     Assessment & Plan:  Primary hypertension Assessment & Plan: Chronic.  Well controlled continue valsartan 80mg  daily.  Monitor bp;s.   Orders: -     Valsartan; Take 1 tablet (80 mg total) by mouth daily.  Dispense: 90 tablet; Refill: 1 -     Basic metabolic panel  Class 2 severe obesity due to excess calories with serious comorbidity and body mass index (BMI) of 36.0 to 36.9 in adult [facility])  Other orders -     Zepbound; Inject 2.5 mg into the skin once  a week.  Dispense: 2 mL; Refill: 0  2.  Obesity w/HTN-overeats.  Couldn't get wegovy d/t supply.  Ins not cover so cash pay.  Will do zepbound 2.5mg  and titrate monthly.  Get co-pay card activated.    Return in about 3 months (around 12/11/2023) for HTN.  I,Rachel Rivera,acting as a scribe for Angelena Sole, MD.,have documented all relevant documentation on the behalf of Angelena Sole, MD,as directed by  Angelena Sole, MD while in the presence of Angelena Sole, MD.  I, Angelena Sole, MD, have reviewed all documentation for this visit. The documentation on 09/10/23 for the exam, diagnosis, procedures, and orders are all accurate and complete.    Angelena Sole, MD

## 2023-09-25 ENCOUNTER — Ambulatory Visit: Payer: BC Managed Care – PPO | Admitting: Family Medicine

## 2023-10-01 ENCOUNTER — Encounter: Payer: Self-pay | Admitting: Family Medicine

## 2023-10-01 ENCOUNTER — Other Ambulatory Visit: Payer: Self-pay | Admitting: Family Medicine

## 2023-10-02 ENCOUNTER — Other Ambulatory Visit (HOSPITAL_BASED_OUTPATIENT_CLINIC_OR_DEPARTMENT_OTHER): Payer: Self-pay

## 2023-10-02 MED ORDER — ZEPBOUND 2.5 MG/0.5ML ~~LOC~~ SOAJ
2.5000 mg | SUBCUTANEOUS | 0 refills | Status: AC
  Filled 2023-10-02: qty 2, 28d supply, fill #0

## 2023-10-14 ENCOUNTER — Other Ambulatory Visit (HOSPITAL_BASED_OUTPATIENT_CLINIC_OR_DEPARTMENT_OTHER): Payer: Self-pay

## 2023-10-21 DIAGNOSIS — Z0000 Encounter for general adult medical examination without abnormal findings: Secondary | ICD-10-CM | POA: Diagnosis not present

## 2023-10-21 DIAGNOSIS — C73 Malignant neoplasm of thyroid gland: Secondary | ICD-10-CM | POA: Diagnosis not present

## 2023-10-21 DIAGNOSIS — E890 Postprocedural hypothyroidism: Secondary | ICD-10-CM | POA: Diagnosis not present

## 2023-10-21 DIAGNOSIS — Z6836 Body mass index (BMI) 36.0-36.9, adult: Secondary | ICD-10-CM | POA: Diagnosis not present

## 2024-01-22 DIAGNOSIS — N945 Secondary dysmenorrhea: Secondary | ICD-10-CM | POA: Diagnosis not present

## 2024-01-22 DIAGNOSIS — R1032 Left lower quadrant pain: Secondary | ICD-10-CM | POA: Diagnosis not present

## 2024-03-02 DIAGNOSIS — E66812 Obesity, class 2: Secondary | ICD-10-CM | POA: Diagnosis not present

## 2024-03-02 DIAGNOSIS — I10 Essential (primary) hypertension: Secondary | ICD-10-CM | POA: Diagnosis not present

## 2024-03-02 DIAGNOSIS — Z1330 Encounter for screening examination for mental health and behavioral disorders, unspecified: Secondary | ICD-10-CM | POA: Diagnosis not present

## 2024-03-02 DIAGNOSIS — E890 Postprocedural hypothyroidism: Secondary | ICD-10-CM | POA: Diagnosis not present

## 2024-03-02 DIAGNOSIS — Z6837 Body mass index (BMI) 37.0-37.9, adult: Secondary | ICD-10-CM | POA: Diagnosis not present

## 2024-03-11 DIAGNOSIS — Z1231 Encounter for screening mammogram for malignant neoplasm of breast: Secondary | ICD-10-CM | POA: Diagnosis not present

## 2024-04-10 ENCOUNTER — Other Ambulatory Visit: Payer: Self-pay | Admitting: Family Medicine

## 2024-04-10 DIAGNOSIS — I10 Essential (primary) hypertension: Secondary | ICD-10-CM

## 2024-05-19 DIAGNOSIS — Z01419 Encounter for gynecological examination (general) (routine) without abnormal findings: Secondary | ICD-10-CM | POA: Diagnosis not present

## 2024-08-17 DIAGNOSIS — I10 Essential (primary) hypertension: Secondary | ICD-10-CM | POA: Diagnosis not present

## 2024-08-17 DIAGNOSIS — Z23 Encounter for immunization: Secondary | ICD-10-CM | POA: Diagnosis not present

## 2024-08-17 DIAGNOSIS — E7801 Familial hypercholesterolemia: Secondary | ICD-10-CM | POA: Diagnosis not present

## 2024-08-17 DIAGNOSIS — E66812 Obesity, class 2: Secondary | ICD-10-CM | POA: Diagnosis not present

## 2024-08-17 DIAGNOSIS — Z6837 Body mass index (BMI) 37.0-37.9, adult: Secondary | ICD-10-CM | POA: Diagnosis not present

## 2024-08-25 DIAGNOSIS — Z79899 Other long term (current) drug therapy: Secondary | ICD-10-CM | POA: Diagnosis not present

## 2024-08-25 DIAGNOSIS — E7801 Familial hypercholesterolemia: Secondary | ICD-10-CM | POA: Diagnosis not present

## 2024-08-25 DIAGNOSIS — E8881 Metabolic syndrome: Secondary | ICD-10-CM | POA: Diagnosis not present

## 2024-10-19 DIAGNOSIS — E890 Postprocedural hypothyroidism: Secondary | ICD-10-CM | POA: Diagnosis not present

## 2024-10-19 DIAGNOSIS — C73 Malignant neoplasm of thyroid gland: Secondary | ICD-10-CM | POA: Diagnosis not present

## 2024-10-22 DIAGNOSIS — I10 Essential (primary) hypertension: Secondary | ICD-10-CM | POA: Diagnosis not present

## 2024-10-22 DIAGNOSIS — R7989 Other specified abnormal findings of blood chemistry: Secondary | ICD-10-CM | POA: Diagnosis not present

## 2024-10-22 DIAGNOSIS — Z6835 Body mass index (BMI) 35.0-35.9, adult: Secondary | ICD-10-CM | POA: Diagnosis not present

## 2024-10-22 DIAGNOSIS — Z0000 Encounter for general adult medical examination without abnormal findings: Secondary | ICD-10-CM | POA: Diagnosis not present

## 2024-10-22 DIAGNOSIS — E78019 Familial hypercholesterolemia, unspecified: Secondary | ICD-10-CM | POA: Diagnosis not present

## 2024-12-25 ENCOUNTER — Other Ambulatory Visit: Payer: Self-pay

## 2024-12-25 ENCOUNTER — Other Ambulatory Visit: Payer: Self-pay | Admitting: Family Medicine

## 2024-12-25 DIAGNOSIS — I10 Essential (primary) hypertension: Secondary | ICD-10-CM

## 2024-12-25 MED ORDER — VALSARTAN 80 MG PO TABS: 80.0000 mg | ORAL_TABLET | Freq: Every day | ORAL | 0 refills | Status: AC
